# Patient Record
Sex: Female | Born: 1935 | Race: Black or African American | Hispanic: No | Smoking: Never smoker
Health system: Southern US, Community
[De-identification: ages and names within clinical notes are randomized; demographics above are authoritative.]

## PROBLEM LIST (undated history)

## (undated) DIAGNOSIS — D696 Thrombocytopenia, unspecified: Secondary | ICD-10-CM

## (undated) DIAGNOSIS — I10 Essential (primary) hypertension: Secondary | ICD-10-CM

## (undated) DIAGNOSIS — R7401 Elevation of levels of liver transaminase levels: Secondary | ICD-10-CM

## (undated) DIAGNOSIS — R9431 Abnormal electrocardiogram [ECG] [EKG]: Secondary | ICD-10-CM

## (undated) DIAGNOSIS — E872 Acidosis, unspecified: Secondary | ICD-10-CM

## (undated) DIAGNOSIS — E785 Hyperlipidemia, unspecified: Secondary | ICD-10-CM

## (undated) DIAGNOSIS — R740 Nonspecific elevation of levels of transaminase and lactic acid dehydrogenase [LDH]: Secondary | ICD-10-CM

## (undated) HISTORY — DX: Hyperlipidemia, unspecified: E78.5

## (undated) HISTORY — DX: Essential (primary) hypertension: I10

## (undated) HISTORY — PX: ABDOMINAL HYSTERECTOMY: SHX81

---

## 2000-08-22 ENCOUNTER — Ambulatory Visit (HOSPITAL_COMMUNITY): Admission: RE | Admit: 2000-08-22 | Discharge: 2000-08-22 | Payer: Self-pay | Admitting: Family Medicine

## 2000-08-22 ENCOUNTER — Encounter: Payer: Self-pay | Admitting: Family Medicine

## 2000-11-27 ENCOUNTER — Encounter: Payer: Self-pay | Admitting: Family Medicine

## 2000-11-27 ENCOUNTER — Ambulatory Visit (HOSPITAL_COMMUNITY): Admission: RE | Admit: 2000-11-27 | Discharge: 2000-11-27 | Payer: Self-pay | Admitting: Family Medicine

## 2002-08-05 ENCOUNTER — Encounter: Payer: Self-pay | Admitting: Family Medicine

## 2002-08-05 ENCOUNTER — Ambulatory Visit (HOSPITAL_COMMUNITY): Admission: RE | Admit: 2002-08-05 | Discharge: 2002-08-05 | Payer: Self-pay | Admitting: Family Medicine

## 2003-03-17 ENCOUNTER — Ambulatory Visit (HOSPITAL_COMMUNITY): Admission: RE | Admit: 2003-03-17 | Discharge: 2003-03-17 | Payer: Self-pay | Admitting: Family Medicine

## 2003-12-09 ENCOUNTER — Ambulatory Visit (HOSPITAL_COMMUNITY): Admission: RE | Admit: 2003-12-09 | Discharge: 2003-12-09 | Payer: Self-pay | Admitting: Internal Medicine

## 2003-12-09 ENCOUNTER — Ambulatory Visit: Payer: Self-pay | Admitting: Internal Medicine

## 2004-05-04 ENCOUNTER — Ambulatory Visit (HOSPITAL_COMMUNITY): Admission: RE | Admit: 2004-05-04 | Discharge: 2004-05-04 | Payer: Self-pay | Admitting: Pediatrics

## 2007-06-07 ENCOUNTER — Ambulatory Visit (HOSPITAL_COMMUNITY): Admission: RE | Admit: 2007-06-07 | Discharge: 2007-06-07 | Payer: Self-pay | Admitting: Family Medicine

## 2007-10-31 ENCOUNTER — Ambulatory Visit: Payer: Self-pay | Admitting: Orthopedic Surgery

## 2007-10-31 DIAGNOSIS — M549 Dorsalgia, unspecified: Secondary | ICD-10-CM | POA: Insufficient documentation

## 2007-10-31 DIAGNOSIS — M25559 Pain in unspecified hip: Secondary | ICD-10-CM | POA: Insufficient documentation

## 2009-02-03 HISTORY — PX: GIVENS CAPSULE STUDY: SHX5432

## 2009-02-03 HISTORY — PX: ESOPHAGOGASTRODUODENOSCOPY: SHX1529

## 2009-02-03 HISTORY — PX: COLONOSCOPY: SHX174

## 2009-02-17 ENCOUNTER — Ambulatory Visit (HOSPITAL_COMMUNITY): Admission: RE | Admit: 2009-02-17 | Discharge: 2009-02-17 | Payer: Self-pay | Admitting: Family Medicine

## 2009-02-18 ENCOUNTER — Inpatient Hospital Stay (HOSPITAL_COMMUNITY): Admission: AD | Admit: 2009-02-18 | Discharge: 2009-02-21 | Payer: Self-pay | Admitting: Family Medicine

## 2009-02-18 ENCOUNTER — Ambulatory Visit: Payer: Self-pay | Admitting: Internal Medicine

## 2009-02-19 ENCOUNTER — Encounter (INDEPENDENT_AMBULATORY_CARE_PROVIDER_SITE_OTHER): Payer: Self-pay | Admitting: Family Medicine

## 2009-02-19 ENCOUNTER — Ambulatory Visit: Payer: Self-pay | Admitting: Gastroenterology

## 2009-02-20 ENCOUNTER — Encounter (INDEPENDENT_AMBULATORY_CARE_PROVIDER_SITE_OTHER): Payer: Self-pay | Admitting: Family Medicine

## 2009-02-20 ENCOUNTER — Ambulatory Visit: Payer: Self-pay | Admitting: Gastroenterology

## 2009-02-23 ENCOUNTER — Ambulatory Visit (HOSPITAL_COMMUNITY): Admission: RE | Admit: 2009-02-23 | Discharge: 2009-02-23 | Payer: Self-pay | Admitting: Gastroenterology

## 2009-02-23 ENCOUNTER — Ambulatory Visit: Payer: Self-pay | Admitting: Gastroenterology

## 2009-05-25 DIAGNOSIS — K5732 Diverticulitis of large intestine without perforation or abscess without bleeding: Secondary | ICD-10-CM | POA: Insufficient documentation

## 2010-02-02 NOTE — Letter (Signed)
Summary: CAPSULE STUDY ORDER  CAPSULE STUDY ORDER   Imported By: Ave Filter 02/20/2009 14:44:41  _____________________________________________________________________  External Attachment:    Type:   Image     Comment:   External Document

## 2010-03-25 LAB — CROSSMATCH
ABO/RH(D): O POS
Antibody Screen: NEGATIVE

## 2010-03-25 LAB — COMPREHENSIVE METABOLIC PANEL
Alkaline Phosphatase: 49 U/L (ref 39–117)
CO2: 26 mEq/L (ref 19–32)
Creatinine, Ser: 0.66 mg/dL (ref 0.4–1.2)
Glucose, Bld: 96 mg/dL (ref 70–99)
Potassium: 3.5 mEq/L (ref 3.5–5.1)
Sodium: 139 mEq/L (ref 135–145)
Total Bilirubin: 0.5 mg/dL (ref 0.3–1.2)
Total Protein: 6.3 g/dL (ref 6.0–8.3)

## 2010-03-25 LAB — CBC
HCT: 32.1 % — ABNORMAL LOW (ref 36.0–46.0)
MCHC: 33.4 g/dL (ref 30.0–36.0)
MCV: 94.5 fL (ref 78.0–100.0)
Platelets: 193 10*3/uL (ref 150–400)
WBC: 6.6 10*3/uL (ref 4.0–10.5)

## 2010-03-25 LAB — DIFFERENTIAL
Monocytes Relative: 7 % (ref 3–12)
Neutro Abs: 4.1 10*3/uL (ref 1.7–7.7)
Neutrophils Relative %: 62 % (ref 43–77)

## 2010-03-25 LAB — HEMOGLOBIN AND HEMATOCRIT, BLOOD: HCT: 27.2 % — ABNORMAL LOW (ref 36.0–46.0)

## 2010-03-25 LAB — ABO/RH: ABO/RH(D): O POS

## 2010-05-21 NOTE — Op Note (Signed)
[name], Whitney         ACCOUNT NO.:  [id number]   MEDICAL RECORD NO.:  [id number]          PATIENT TYPE:  AMB   LOCATION:  DAY                           FACILITY:  [facility]   PHYSICIAN:  R. Roetta Sessions, M.D. DATE OF BIRTH:  1932/01/01   DATE OF PROCEDURE:  12/09/2003  DATE OF DISCHARGE:                                 OPERATIVE REPORT   PROCEDURE:  Screening colonoscopy.   INDICATION FOR PROCEDURE:  The patient is a 77 year old lady referred out of  the courtesy of Mila Homer. Sudie Bailey, M.D., for colorectal cancer screening.  She has never had a screening colonoscopy.  She is devoid of any lower GI  tract symptoms.  There is no family history of colorectal neoplasia.  Colonoscopy is now being done as a screening  maneuver.  This approach has  been discussed with the patient at length, potential risks, benefits, and  alternatives have been reviewed, questions answered, and she is agreeable.  Please see documentation in the medical record.   PROCEDURE NOTE:  O2 saturation, blood pressure, pulse, and respiration were  monitored throughout the entire procedure.   CONSCIOUS SEDATION:  Versed 4 mg IV, Demerol 75 mg IV in divided doses.   INSTRUMENT USED:  Olympus video chip system.   FINDINGS:  Digital rectal exam revealed no abnormalities.   Endoscopic findings:  The prep was adequate.   Rectum:  Examination of the rectal mucosa, including retroflexed view of the  anal verge, revealed only a single anal papilla.   Colon:  The colonic mucosa was surveyed from the rectosigmoid junction  through the left, transverse, right colon, to the area of the appendiceal  orifice, ileocecal valve, and cecum.  These structures were well-seen and  photographed for the record.  From this level the scope was slowly withdrawn  and all previously-mentioned mucosal surfaces were again seen.  The patient  was noted to have pancolonic diverticula.  The remainder of the colonic  mucosa appeared  normal.  The patient tolerated the procedure well and was  reacted in endoscopy.   IMPRESSION:  1.  Anal papilla, otherwise normal rectum.  2.  Pancolonic diverticula.  The remainder of the colonic mucosa appeared      normal.   RECOMMENDATIONS:  1.  Diverticulosis literature provided to [name].  2.  Consider repeat colonoscopy for screening purposes in 10 years.     Otelia Sergeant   RMR/MEDQ  D:  12/09/2003  T:  12/09/2003  Job:  [id number]   cc:   Mila Homer. Sudie Bailey, M.D.  [address] [place], [place] [address]  Fax: [phone]

## 2012-03-27 ENCOUNTER — Other Ambulatory Visit (HOSPITAL_COMMUNITY): Payer: Self-pay | Admitting: Cardiovascular Disease

## 2012-03-27 DIAGNOSIS — R079 Chest pain, unspecified: Secondary | ICD-10-CM

## 2012-04-03 ENCOUNTER — Ambulatory Visit (HOSPITAL_COMMUNITY)
Admission: RE | Admit: 2012-04-03 | Discharge: 2012-04-03 | Disposition: A | Discharge status: Home / Self Care | Payer: Medicare Other | Source: Ambulatory Visit | Attending: Cardiovascular Disease | Admitting: Cardiovascular Disease

## 2012-04-03 DIAGNOSIS — R5383 Other fatigue: Secondary | ICD-10-CM | POA: Insufficient documentation

## 2012-04-03 DIAGNOSIS — R42 Dizziness and giddiness: Secondary | ICD-10-CM | POA: Insufficient documentation

## 2012-04-03 DIAGNOSIS — I10 Essential (primary) hypertension: Secondary | ICD-10-CM | POA: Insufficient documentation

## 2012-04-03 DIAGNOSIS — R5381 Other malaise: Secondary | ICD-10-CM | POA: Insufficient documentation

## 2012-04-03 DIAGNOSIS — R079 Chest pain, unspecified: Secondary | ICD-10-CM | POA: Insufficient documentation

## 2012-04-03 DIAGNOSIS — E669 Obesity, unspecified: Secondary | ICD-10-CM | POA: Insufficient documentation

## 2012-04-03 HISTORY — PX: CARDIOVASCULAR STRESS TEST: SHX262

## 2012-04-03 MED ORDER — TECHNETIUM TC 99M SESTAMIBI GENERIC - CARDIOLITE
30.0000 | Freq: Once | INTRAVENOUS | Status: AC | PRN
  Administered 2012-04-03: 30 via INTRAVENOUS

## 2012-04-03 MED ORDER — REGADENOSON 0.4 MG/5ML IV SOLN
0.4000 mg | Freq: Once | INTRAVENOUS | Status: AC
  Administered 2012-04-03: 0.4 mg via INTRAVENOUS

## 2012-04-03 MED ORDER — TECHNETIUM TC 99M SESTAMIBI GENERIC - CARDIOLITE
10.0000 | Freq: Once | INTRAVENOUS | Status: AC | PRN
  Administered 2012-04-03: 10 via INTRAVENOUS

## 2012-04-03 NOTE — Procedures (Addendum)
  [facility] [address] [place] [address] [place] [address] [phone]  Cardiology Nuclear Med Study  Whitney [name] is a 79 y.o. female     MRN : [id number]     DOB: Jul 07, 1931  Procedure Date: 04/03/2012  Nuclear Med Background Indication for Stress Test:  Abnormal EKG History:  NO PRIOR CARDIAC HISTORY Cardiac Risk Factors: Hypertension, Lipids and Obesity  Symptoms:  Chest Pain, Fatigue and Light-Headedness   Nuclear Pre-Procedure Caffeine/Decaff Intake:  10:00pm NPO After: 8:00am   IV Site: R Forearm  IV 0.9% NS with Angio Cath:  22g  Chest Size (in):  N/A IV Started by: Emmit Pomfret, RN  Height: 5\' 2"  (1.575 m)  Cup Size: B  BMI:  Body mass index is 41.33 kg/(m^2). Weight:  226 lb (102.513 kg)   Tech Comments:  N/A    Nuclear Med Study 1 or 2 day study: 1 day  Stress Test Type:  Lexiscan  Order Authorizing Provider:  Nanetta Batty, MD   Resting Radionuclide: Technetium 21m Sestamibi  Resting Radionuclide Dose: 10.8 mCi   Stress Radionuclide:  Technetium 39m Sestamibi  Stress Radionuclide Dose: 30.6 mCi           Stress Protocol Rest HR: 59 Stress HR: 82  Rest BP: 157/75 Stress BP: 157/55  Exercise Time (min): n/a METS: n/a   Predicted Max HR: 142 bpm % Max HR: 57.75 bpm Rate Pressure Product: 21308  Dose of Adenosine (mg):  n/a Dose of Lexiscan: 0.4 mg  Dose of Atropine (mg): n/a Dose of Dobutamine: n/a mcg/kg/min (at max HR)  Stress Test Technologist: Esperanza Sheets, CCT Nuclear Technologist: Koren Shiver, CNMT   Rest Procedure:  Myocardial perfusion imaging was performed at rest 45 minutes following the intravenous administration of Technetium 84m Sestamibi. Stress Procedure:  The patient received IV Lexiscan 0.4 mg over 15-seconds.  Technetium 59m Sestamibi injected at 30-seconds.  There were no significant changes with Lexiscan.  Quantitative spect images were obtained after a 45 minute  delay.  Transient Ischemic Dilatation (Normal <1.22):  0.96 Lung/Heart Ratio (Normal <0.45):  0.26 QGS EDV:  67  ml QGS ESV:  19 ml LV Ejection Fraction: 72%     Rest ECG: NSR-LVH  Stress ECG: No significant change from baseline ECG  QPS Raw Data Images:  Normal; no motion artifact; normal heart/lung ratio. Stress Images:  Normal homogeneous uptake in all areas of the myocardium. Rest Images:  Normal homogeneous uptake in all areas of the myocardium. Subtraction (SDS):  No evidence of ischemia.  Impression Exercise Capacity:  Lexiscan with no exercise. BP Response:  Hypertensive blood pressure response. Clinical Symptoms:  No significant symptoms noted. ECG Impression:  No significant ST segment change suggestive of ischemia. Comparison with Prior Nuclear Study: No previous nuclear study performed  Overall Impression:  Normal stress nuclear study.  LV Wall Motion:  NL LV Function; NL Wall Motion   [name], MD  04/03/2012 12:43 PM

## 2012-10-26 ENCOUNTER — Encounter: Payer: Self-pay | Admitting: Cardiovascular Disease

## 2012-10-26 ENCOUNTER — Ambulatory Visit (INDEPENDENT_AMBULATORY_CARE_PROVIDER_SITE_OTHER): Payer: Medicare Other | Admitting: Cardiovascular Disease

## 2012-10-26 VITALS — BP 160/80 | HR 52 | Ht 65.0 in | Wt 224.0 lb

## 2012-10-26 DIAGNOSIS — E785 Hyperlipidemia, unspecified: Secondary | ICD-10-CM

## 2012-10-26 DIAGNOSIS — I10 Essential (primary) hypertension: Secondary | ICD-10-CM

## 2012-10-26 NOTE — Progress Notes (Signed)
10/26/2012 Whitney [name]   [birth year]  [id number]  Primary Physician Milana Obey, MD Primary Cardiologist: Runell Gess MD Roseanne Reno   HPI:  The patient is a 79 year old moderately-overweight widowed African American female, mother of 7 and grandmother to 10 grandchildren, referred through the courtesy of Dr. Sudie Bailey for cardiovascular evaluation because of new-onset chest pressure. She lives with her daughter and son. Her risk factors include treated hypertension and hyperlipidemia. She does not smoke or drink, nor is there a family history. She has never had a heart attack or stroke. Her past surgical history is remarkable for a remote hysterectomy. Over the last 3 weeks she has noticed substernal chest pressure, which has occurred approximately 3 times, associated with weakness. There is some radiation to her left shoulder but no shortness of breath. She had a Myoview stress test performed 04/03/12 which is entirely normal. She's had no recurrent symptoms and she was seen 6 months ago.     Current Outpatient Prescriptions  Medication Sig Dispense Refill  . atenolol (TENORMIN) 25 MG tablet Take 25 mg by mouth daily.      Marland Kitchen atorvastatin (LIPITOR) 40 MG tablet Take 40 mg by mouth at bedtime.      Marland Kitchen diltiazem (DILACOR XR) 180 MG 24 hr capsule Take 180 mg by mouth daily.      . Ferrous Sulfate (PX IRON) 27 MG TABS Take 1 tablet by mouth daily.      . furosemide (LASIX) 40 MG tablet Take 60 mg by mouth every other day.      . lisinopril (PRINIVIL,ZESTRIL) 20 MG tablet Take 20 mg by mouth daily.      . Misc Natural Products (OSTEO BI-FLEX ADV TRIPLE ST) TABS Take 1 tablet by mouth as needed.      . potassium chloride SA (K-DUR,KLOR-CON) 20 MEQ tablet Take 20 mEq by mouth. Takes it when the lasix is taken      . Vitamin D, Ergocalciferol, (DRISDOL) 50000 UNITS CAPS capsule Take 50,000 Units by mouth every 7 (seven) days.      . Vitamin Mixture (VITAMIN E  COMPLETE) CAPS Take 1 capsule by mouth daily.       No current facility-administered medications for this visit.    No Known Allergies  History   Social History  . Marital Status: Widowed    Spouse Name: N/A    Number of Children: N/A  . Years of Education: N/A   Occupational History  . Not on file.   Social History Main Topics  . Smoking status: Never Smoker   . Smokeless tobacco: Not on file  . Alcohol Use: No  . Drug Use: Not on file  . Sexual Activity: Not on file   Other Topics Concern  . Not on file   Social History Narrative  . No narrative on file     Review of Systems: General: negative for chills, fever, night sweats or weight changes.  Cardiovascular: negative for chest pain, dyspnea on exertion, edema, orthopnea, palpitations, paroxysmal nocturnal dyspnea or shortness of breath Dermatological: negative for rash Respiratory: negative for cough or wheezing Urologic: negative for hematuria Abdominal: negative for nausea, vomiting, diarrhea, bright red blood per rectum, melena, or hematemesis Neurologic: negative for visual changes, syncope, or dizziness All other systems reviewed and are otherwise negative except as noted above.    Blood pressure 160/80, pulse 52, height 5\' 5"  (1.651 m), weight 224 lb (101.606 kg).  General appearance: alert and no distress  Neck: no adenopathy, no carotid bruit, no JVD, supple, symmetrical, trachea midline and thyroid not enlarged, symmetric, no tenderness/mass/nodules Lungs: clear to auscultation bilaterally Heart: regular rate and rhythm, S1, S2 normal, no murmur, click, rub or gallop Extremities: extremities normal, atraumatic, no cyanosis or edema  EKG sinus bradycardia at 52 with evidence of left ventricular hypertrophy by voltage criteria  ASSESSMENT AND PLAN:   Essential hypertension Poorly controlled on current medications. She does check her blood pressure several times a week which  runs in the 140/80  range.  Hyperlipidemia On statin therapy. Her last lipid profile performed 03/19/12 revealed a total cholesterol of 122, LDL of 70 and HDL of 40.      Runell Gess MD FACP,FACC,FAHA, [facility] 10/26/2012 9:52 AM

## 2012-10-26 NOTE — Patient Instructions (Signed)
Dr Berry wants you to follow-up in 1 year . You will receive a reminder letter in the mail two months in advance. If you don't receive a letter, please call our office to schedule the follow-up appointment. 

## 2012-10-26 NOTE — Assessment & Plan Note (Signed)
On statin therapy. Her last lipid profile performed 03/19/12 revealed a total cholesterol of 122, LDL of 70 and HDL of 40.

## 2012-10-26 NOTE — Assessment & Plan Note (Signed)
Poorly controlled on current medications. She does check her blood pressure several times a week which  runs in the 140/80 range.

## 2013-01-21 ENCOUNTER — Other Ambulatory Visit: Payer: Self-pay

## 2013-01-21 ENCOUNTER — Encounter (HOSPITAL_COMMUNITY): Payer: Self-pay | Admitting: Emergency Medicine

## 2013-01-21 ENCOUNTER — Inpatient Hospital Stay (HOSPITAL_COMMUNITY)
Admission: EM | Admit: 2013-01-21 | Discharge: 2013-01-26 | DRG: 442 | Disposition: A | Discharge status: Home / Self Care | Payer: Medicare Other | Attending: Internal Medicine | Admitting: Internal Medicine

## 2013-01-21 ENCOUNTER — Inpatient Hospital Stay (HOSPITAL_COMMUNITY): Payer: Medicare Other

## 2013-01-21 DIAGNOSIS — D696 Thrombocytopenia, unspecified: Secondary | ICD-10-CM | POA: Diagnosis present

## 2013-01-21 DIAGNOSIS — K8000 Calculus of gallbladder with acute cholecystitis without obstruction: Secondary | ICD-10-CM | POA: Diagnosis present

## 2013-01-21 DIAGNOSIS — R7881 Bacteremia: Secondary | ICD-10-CM | POA: Diagnosis present

## 2013-01-21 DIAGNOSIS — I959 Hypotension, unspecified: Secondary | ICD-10-CM | POA: Diagnosis present

## 2013-01-21 DIAGNOSIS — R7402 Elevation of levels of lactic acid dehydrogenase (LDH): Secondary | ICD-10-CM

## 2013-01-21 DIAGNOSIS — Z79899 Other long term (current) drug therapy: Secondary | ICD-10-CM

## 2013-01-21 DIAGNOSIS — E861 Hypovolemia: Secondary | ICD-10-CM | POA: Diagnosis present

## 2013-01-21 DIAGNOSIS — I2489 Other forms of acute ischemic heart disease: Secondary | ICD-10-CM | POA: Diagnosis present

## 2013-01-21 DIAGNOSIS — N179 Acute kidney failure, unspecified: Secondary | ICD-10-CM | POA: Diagnosis present

## 2013-01-21 DIAGNOSIS — E872 Acidosis, unspecified: Secondary | ICD-10-CM | POA: Diagnosis present

## 2013-01-21 DIAGNOSIS — R112 Nausea with vomiting, unspecified: Secondary | ICD-10-CM | POA: Diagnosis present

## 2013-01-21 DIAGNOSIS — R109 Unspecified abdominal pain: Secondary | ICD-10-CM

## 2013-01-21 DIAGNOSIS — R000 Tachycardia, unspecified: Secondary | ICD-10-CM | POA: Diagnosis present

## 2013-01-21 DIAGNOSIS — I2510 Atherosclerotic heart disease of native coronary artery without angina pectoris: Secondary | ICD-10-CM | POA: Diagnosis present

## 2013-01-21 DIAGNOSIS — D6959 Other secondary thrombocytopenia: Secondary | ICD-10-CM | POA: Diagnosis present

## 2013-01-21 DIAGNOSIS — R5381 Other malaise: Secondary | ICD-10-CM

## 2013-01-21 DIAGNOSIS — R531 Weakness: Secondary | ICD-10-CM

## 2013-01-21 DIAGNOSIS — R197 Diarrhea, unspecified: Secondary | ICD-10-CM

## 2013-01-21 DIAGNOSIS — K7689 Other specified diseases of liver: Principal | ICD-10-CM | POA: Diagnosis present

## 2013-01-21 DIAGNOSIS — R9431 Abnormal electrocardiogram [ECG] [EKG]: Secondary | ICD-10-CM | POA: Diagnosis present

## 2013-01-21 DIAGNOSIS — I248 Other forms of acute ischemic heart disease: Secondary | ICD-10-CM | POA: Diagnosis present

## 2013-01-21 DIAGNOSIS — D72829 Elevated white blood cell count, unspecified: Secondary | ICD-10-CM

## 2013-01-21 DIAGNOSIS — E860 Dehydration: Secondary | ICD-10-CM | POA: Diagnosis present

## 2013-01-21 DIAGNOSIS — R740 Nonspecific elevation of levels of transaminase and lactic acid dehydrogenase [LDH]: Secondary | ICD-10-CM

## 2013-01-21 DIAGNOSIS — K219 Gastro-esophageal reflux disease without esophagitis: Secondary | ICD-10-CM | POA: Diagnosis present

## 2013-01-21 DIAGNOSIS — R5383 Other fatigue: Secondary | ICD-10-CM

## 2013-01-21 DIAGNOSIS — R1011 Right upper quadrant pain: Secondary | ICD-10-CM

## 2013-01-21 DIAGNOSIS — D62 Acute posthemorrhagic anemia: Secondary | ICD-10-CM | POA: Diagnosis present

## 2013-01-21 DIAGNOSIS — R7401 Elevation of levels of liver transaminase levels: Secondary | ICD-10-CM | POA: Diagnosis present

## 2013-01-21 DIAGNOSIS — I517 Cardiomegaly: Secondary | ICD-10-CM

## 2013-01-21 DIAGNOSIS — D649 Anemia, unspecified: Secondary | ICD-10-CM | POA: Diagnosis present

## 2013-01-21 DIAGNOSIS — E876 Hypokalemia: Secondary | ICD-10-CM | POA: Diagnosis present

## 2013-01-21 DIAGNOSIS — Z833 Family history of diabetes mellitus: Secondary | ICD-10-CM

## 2013-01-21 DIAGNOSIS — E785 Hyperlipidemia, unspecified: Secondary | ICD-10-CM | POA: Diagnosis present

## 2013-01-21 DIAGNOSIS — I10 Essential (primary) hypertension: Secondary | ICD-10-CM | POA: Diagnosis present

## 2013-01-21 LAB — COMPREHENSIVE METABOLIC PANEL
ALBUMIN: 3.1 g/dL — AB (ref 3.5–5.2)
ALT: 228 U/L — AB (ref 0–35)
AST: 102 U/L — ABNORMAL HIGH (ref 0–37)
Alkaline Phosphatase: 62 U/L (ref 39–117)
BUN: 27 mg/dL — ABNORMAL HIGH (ref 6–23)
CO2: 26 mEq/L (ref 19–32)
Calcium: 8 mg/dL — ABNORMAL LOW (ref 8.4–10.5)
Chloride: 101 mEq/L (ref 96–112)
Creatinine, Ser: 1.34 mg/dL — ABNORMAL HIGH (ref 0.50–1.10)
GFR calc non Af Amer: 37 mL/min — ABNORMAL LOW (ref >90)
GFR, EST AFRICAN AMERICAN: 42 mL/min — AB (ref >90)
GLUCOSE: 160 mg/dL — AB (ref 70–99)
POTASSIUM: 3 meq/L — AB (ref 3.7–5.3)
SODIUM: 143 meq/L (ref 137–147)
Total Bilirubin: 1.1 mg/dL (ref 0.3–1.2)
Total Protein: 5.7 g/dL — ABNORMAL LOW (ref 6.0–8.3)

## 2013-01-21 LAB — LIPASE, BLOOD: Lipase: 67 U/L — ABNORMAL HIGH (ref 11–59)

## 2013-01-21 LAB — MRSA PCR SCREENING: MRSA BY PCR: NEGATIVE

## 2013-01-21 LAB — CBC WITH DIFFERENTIAL/PLATELET
Basophils Absolute: 0 10*3/uL (ref 0.0–0.1)
Basophils Relative: 0 % (ref 0–1)
EOS ABS: 0 10*3/uL (ref 0.0–0.7)
Eosinophils Relative: 0 % (ref 0–5)
HCT: 23.6 % — ABNORMAL LOW (ref 36.0–46.0)
HEMOGLOBIN: 7.4 g/dL — AB (ref 12.0–15.0)
LYMPHS ABS: 3.4 10*3/uL (ref 0.7–4.0)
Lymphocytes Relative: 23 % (ref 12–46)
MCH: 30.6 pg (ref 26.0–34.0)
MCHC: 31.4 g/dL (ref 30.0–36.0)
MCV: 97.5 fL (ref 78.0–100.0)
MONOS PCT: 9 % (ref 3–12)
Monocytes Absolute: 1.2 10*3/uL — ABNORMAL HIGH (ref 0.1–1.0)
NEUTROS ABS: 9.9 10*3/uL — AB (ref 1.7–7.7)
NEUTROS PCT: 68 % (ref 43–77)
Platelets: 93 10*3/uL — ABNORMAL LOW (ref 150–400)
RBC: 2.42 MIL/uL — AB (ref 3.87–5.11)
RDW: 13.4 % (ref 11.5–15.5)
WBC: 14.5 10*3/uL — AB (ref 4.0–10.5)

## 2013-01-21 LAB — VITAMIN B12: Vitamin B-12: 412 pg/mL (ref 211–911)

## 2013-01-21 LAB — FOLATE: Folate: >20 ng/mL

## 2013-01-21 LAB — HEPARIN LEVEL (UNFRACTIONATED): Heparin Unfractionated: <0.1 IU/mL — ABNORMAL LOW (ref 0.30–0.70)

## 2013-01-21 LAB — LACTIC ACID, PLASMA: Lactic Acid, Venous: 4.1 mmol/L — ABNORMAL HIGH (ref 0.5–2.2)

## 2013-01-21 LAB — PREPARE RBC (CROSSMATCH)

## 2013-01-21 LAB — ABO/RH: ABO/RH(D): O POS

## 2013-01-21 LAB — HEMOGLOBIN AND HEMATOCRIT, BLOOD
HCT: 21.9 % — ABNORMAL LOW (ref 36.0–46.0)
HEMOGLOBIN: 7.4 g/dL — AB (ref 12.0–15.0)

## 2013-01-21 LAB — RETICULOCYTES
RBC.: 2.32 MIL/uL — AB (ref 3.87–5.11)
RETIC CT PCT: 3.2 % — AB (ref 0.4–3.1)
Retic Count, Absolute: 74.2 10*3/uL (ref 19.0–186.0)

## 2013-01-21 LAB — URINALYSIS, ROUTINE W REFLEX MICROSCOPIC
Glucose, UA: NEGATIVE mg/dL
HGB URINE DIPSTICK: NEGATIVE
KETONES UR: 15 mg/dL — AB
Leukocytes, UA: NEGATIVE
Nitrite: NEGATIVE
PH: 5.5 (ref 5.0–8.0)
Protein, ur: NEGATIVE mg/dL
SPECIFIC GRAVITY, URINE: 1.03 (ref 1.005–1.030)
Urobilinogen, UA: 1 mg/dL (ref 0.0–1.0)

## 2013-01-21 LAB — IRON AND TIBC
Iron: 51 ug/dL (ref 42–135)
SATURATION RATIOS: 21 % (ref 20–55)
TIBC: 240 ug/dL — AB (ref 250–470)
UIBC: 189 ug/dL (ref 125–400)

## 2013-01-21 LAB — PROTIME-INR
INR: 1.17 (ref 0.00–1.49)
Prothrombin Time: 14.7 seconds (ref 11.6–15.2)

## 2013-01-21 LAB — TROPONIN I

## 2013-01-21 LAB — FERRITIN: FERRITIN: 118 ng/mL (ref 10–291)

## 2013-01-21 MED ORDER — METOPROLOL TARTRATE 1 MG/ML IV SOLN: 2.5000 mg | Freq: Four times a day (QID) | INTRAVENOUS | Status: DC

## 2013-01-21 MED ORDER — ONDANSETRON HCL 4 MG/2ML IJ SOLN: 4.0000 mg | Freq: Four times a day (QID) | INTRAMUSCULAR | Status: DC | PRN

## 2013-01-21 MED ORDER — PIPERACILLIN-TAZOBACTAM 3.375 G IVPB
3.3750 g | Freq: Three times a day (TID) | INTRAVENOUS | Status: DC
  Administered 2013-01-21 – 2013-01-22 (×3): 3.375 g via INTRAVENOUS
  Filled 2013-01-21 (×5): qty 50

## 2013-01-21 MED ORDER — HEPARIN (PORCINE) IN NACL 100-0.45 UNIT/ML-% IJ SOLN
950.0000 [IU]/h | INTRAMUSCULAR | Status: DC
  Administered 2013-01-21: 950 [IU]/h via INTRAVENOUS

## 2013-01-21 MED ORDER — METOPROLOL TARTRATE 1 MG/ML IV SOLN
2.5000 mg | Freq: Once | INTRAVENOUS | Status: AC
  Administered 2013-01-21: 2.5 mg via INTRAVENOUS
  Filled 2013-01-21: qty 5

## 2013-01-21 MED ORDER — SODIUM CHLORIDE 0.9 % IJ SOLN
3.0000 mL | Freq: Two times a day (BID) | INTRAMUSCULAR | Status: DC
  Administered 2013-01-21 – 2013-01-22 (×2): 3 mL via INTRAVENOUS
  Administered 2013-01-22: 11:00:00 via INTRAVENOUS
  Administered 2013-01-23 – 2013-01-26 (×7): 3 mL via INTRAVENOUS

## 2013-01-21 MED ORDER — PANTOPRAZOLE SODIUM 40 MG IV SOLR
40.0000 mg | Freq: Two times a day (BID) | INTRAVENOUS | Status: DC
  Administered 2013-01-21 – 2013-01-23 (×5): 40 mg via INTRAVENOUS
  Filled 2013-01-21 (×7): qty 40

## 2013-01-21 MED ORDER — IOHEXOL 300 MG/ML  SOLN: 25.0000 mL | INTRAMUSCULAR | Status: AC

## 2013-01-21 MED ORDER — SODIUM CHLORIDE 0.9 % IV SOLN: INTRAVENOUS | Status: DC

## 2013-01-21 MED ORDER — VANCOMYCIN HCL IN DEXTROSE 750-5 MG/150ML-% IV SOLN
750.0000 mg | Freq: Two times a day (BID) | INTRAVENOUS | Status: DC
  Administered 2013-01-22: 750 mg via INTRAVENOUS
  Filled 2013-01-21 (×2): qty 150

## 2013-01-21 MED ORDER — PIPERACILLIN-TAZOBACTAM 3.375 G IVPB 30 MIN: 3.3750 g | Freq: Three times a day (TID) | INTRAVENOUS | Status: DC

## 2013-01-21 MED ORDER — HEPARIN BOLUS VIA INFUSION
4000.0000 [IU] | Freq: Once | INTRAVENOUS | Status: AC
  Administered 2013-01-21: 4000 [IU] via INTRAVENOUS

## 2013-01-21 MED ORDER — ASPIRIN 81 MG PO CHEW
324.0000 mg | CHEWABLE_TABLET | Freq: Once | ORAL | Status: AC
  Administered 2013-01-21: 324 mg via ORAL
  Filled 2013-01-21: qty 4

## 2013-01-21 MED ORDER — ACETAMINOPHEN 650 MG RE SUPP: 650.0000 mg | Freq: Four times a day (QID) | RECTAL | Status: DC | PRN

## 2013-01-21 MED ORDER — ALUM & MAG HYDROXIDE-SIMETH 200-200-20 MG/5ML PO SUSP: 30.0000 mL | Freq: Four times a day (QID) | ORAL | Status: DC | PRN

## 2013-01-21 MED ORDER — METOPROLOL TARTRATE 1 MG/ML IV SOLN
2.5000 mg | Freq: Three times a day (TID) | INTRAVENOUS | Status: DC
  Administered 2013-01-21 – 2013-01-22 (×3): 2.5 mg via INTRAVENOUS
  Filled 2013-01-21 (×6): qty 5

## 2013-01-21 MED ORDER — POTASSIUM CHLORIDE CRYS ER 20 MEQ PO TBCR
40.0000 meq | EXTENDED_RELEASE_TABLET | Freq: Once | ORAL | Status: AC
  Administered 2013-01-21: 40 meq via ORAL
  Filled 2013-01-21: qty 2

## 2013-01-21 MED ORDER — HEPARIN (PORCINE) IN NACL 100-0.45 UNIT/ML-% IJ SOLN
INTRAMUSCULAR | Status: AC
  Administered 2013-01-21: 950 [IU]/h via INTRAVENOUS
  Filled 2013-01-21: qty 250

## 2013-01-21 MED ORDER — HYDROCODONE-ACETAMINOPHEN 5-325 MG PO TABS: 1.0000 | ORAL_TABLET | ORAL | Status: DC | PRN

## 2013-01-21 MED ORDER — LORAZEPAM 0.5 MG PO TABS
0.5000 mg | ORAL_TABLET | Freq: Every evening | ORAL | Status: DC | PRN
  Administered 2013-01-24 – 2013-01-25 (×2): 0.5 mg via ORAL
  Filled 2013-01-21 (×2): qty 1

## 2013-01-21 MED ORDER — ONDANSETRON HCL 4 MG PO TABS: 4.0000 mg | ORAL_TABLET | Freq: Four times a day (QID) | ORAL | Status: DC | PRN

## 2013-01-21 MED ORDER — SODIUM CHLORIDE 0.9 % IV BOLUS (SEPSIS)
1000.0000 mL | Freq: Once | INTRAVENOUS | Status: AC
  Administered 2013-01-21: 1000 mL via INTRAVENOUS

## 2013-01-21 MED ORDER — HYDROMORPHONE HCL PF 1 MG/ML IJ SOLN
1.0000 mg | INTRAMUSCULAR | Status: DC | PRN
  Administered 2013-01-22: 1 mg via INTRAVENOUS
  Filled 2013-01-21 (×2): qty 1

## 2013-01-21 MED ORDER — ACETAMINOPHEN 325 MG PO TABS: 650.0000 mg | ORAL_TABLET | Freq: Four times a day (QID) | ORAL | Status: DC | PRN

## 2013-01-21 MED ORDER — VANCOMYCIN HCL 10 G IV SOLR
1500.0000 mg | Freq: Once | INTRAVENOUS | Status: AC
  Administered 2013-01-21: 1500 mg via INTRAVENOUS
  Filled 2013-01-21: qty 1500

## 2013-01-21 NOTE — ED Provider Notes (Signed)
TIME SEEN: 9:34 AM  CHIEF COMPLAINT: generalized weakness and diaphoresis  HPI: HPI Comments: Whitney [name] is a 80 y.o. female with a hx of HTN, hyperlipidemia who presents to the Emergency Department complaining of weakness, diaphoresis. Patient reports that 3 days ago she had 3 days of nonbloody, nonbilious vomiting and nonbloody diarrhea. This resolved over the weekend but she still feels very weak. She denies any chest pain or shortness of breath. No bloody stools or melena. No dysuria or hematuria. No fevers or chills. No prior history of MI, cardiac catheterization. Patient was hypotensive with EMS but this is improving with IV fluids.   Milana ObeyKNOWLTON,STEPHEN D, MD  ROS: See HPI Constitutional: no fever  Eyes: no drainage  ENT: no runny nose   Cardiovascular:  no chest pain  Resp: no SOB  GI:  vomiting GU: no dysuria Integumentary: no rash  Allergy: no hives  Musculoskeletal: no leg swelling  Neurological: no slurred speech ROS otherwise negative  PAST MEDICAL HISTORY/PAST SURGICAL HISTORY:  Past Medical History  Diagnosis Date  . Hyperlipidemia   . Hypertension     MEDICATIONS:  Prior to Admission medications   Medication Sig Start Date End Date Taking? Authorizing Provider  atenolol (TENORMIN) 25 MG tablet Take 25 mg by mouth daily.    Historical Provider, MD  atorvastatin (LIPITOR) 40 MG tablet Take 40 mg by mouth at bedtime.    Historical Provider, MD  diltiazem (DILACOR XR) 180 MG 24 hr capsule Take 180 mg by mouth daily.    Historical Provider, MD  Ferrous Sulfate (PX IRON) 27 MG TABS Take 1 tablet by mouth daily.    Historical Provider, MD  furosemide (LASIX) 40 MG tablet Take 60 mg by mouth every other day.    Historical Provider, MD  lisinopril (PRINIVIL,ZESTRIL) 20 MG tablet Take 20 mg by mouth daily.    Historical Provider, MD  Misc Natural Products (OSTEO BI-FLEX ADV TRIPLE ST) TABS Take 1 tablet by mouth as needed.    Historical Provider, MD  potassium  chloride SA (K-DUR,KLOR-CON) 20 MEQ tablet Take 20 mEq by mouth. Takes it when the lasix is taken    Historical Provider, MD  Vitamin D, Ergocalciferol, (DRISDOL) 50000 UNITS CAPS capsule Take 50,000 Units by mouth every 7 (seven) days.    Historical Provider, MD  Vitamin Mixture (VITAMIN E COMPLETE) CAPS Take 1 capsule by mouth daily.    Historical Provider, MD    ALLERGIES:  No Known Allergies  SOCIAL HISTORY:  History  Substance Use Topics  . Smoking status: Never Smoker   . Smokeless tobacco: Never Used  . Alcohol Use: No    FAMILY HISTORY: Family History  Problem Relation Age of Onset  . Diabetes Child     EXAM: BP 92/55  Pulse 125  Temp(Src) 97.5 F (36.4 C)  Resp 30  Ht 5\' 5"  (1.651 m)  Wt 210 lb (95.255 kg)  BMI 34.95 kg/m2  SpO2 100% CONSTITUTIONAL: Alert and oriented and responds appropriately to questions. Well-appearing; well-nourished HEAD: Normocephalic EYES: Conjunctivae clear, PERRL ENT: normal nose; no rhinorrhea; dry mucous membranes; pharynx without lesions noted NECK: Supple, no meningismus, no LAD  CARD: regular and tachycardic; S1 and S2 appreciated; no murmurs, no clicks, no rubs, no gallops RESP: Normal chest excursion without splinting or tachypnea; breath sounds clear and equal bilaterally; no wheezes, no rhonchi, no rales,  ABD/GI: Normal bowel sounds; non-distended; soft, mildly diffusely tender to palpation, no rebound, no guarding, no peritoneal signs  BACK:  The back appears normal and is non-tender to palpation, there is no CVA tenderness EXT: Normal ROM in all joints; non-tender to palpation; no edema; normal capillary refill; no cyanosis    SKIN: Normal color for age and race; warm NEURO: Moves all extremities equally PSYCH: The patient's mood and manner are appropriate. Grooming and personal hygiene are appropriate.  MEDICAL DECISION MAKING: Patient here with hypotension, tachycardia. Concern for possible dehydration versus cardiogenic  shock. EKG shows diffuse ST depression with elevation in aVR concerning for possible STEMI. Will discuss with cardiology on call. Will obtain labs, urine, cultures and continue IV fluids.  ED PROGRESS: 9:40 AM  Discussed with Dr. Excell Seltzer with cardiology. He agrees that we should send the patient emergently to [facility] cone for further evaluation and admission to the CCU. He does not think we need to call this a code STEMI at this time. He does recommend giving patient aspirin and heparin. We'll continue IV fluids. If blood pressure improves, Dr. Excell Seltzer would like [place] to administer 2.5 mg of IV metoprolol. Patient and daughter updated with plan. Labs pending.   10:10 AM  Patient's blood pressure has improved to 120s/60s. Will give small dose of metoprolol. Her heart rate is still in the 100s. She is still not having chest pain or shortness of breath. Continuing fluids and heparin.   CRITICAL CARE Performed by: Raelyn Number   Total critical care time: 30 minutes  Critical care time was exclusive of separately billable procedures and treating other patients.  hypotension, tachycardia, IV fluids, possible STEMI, heparin drip  Critical care was necessary to treat or prevent imminent or life-threatening deterioration.  Critical care was time spent personally by me on the following activities: development of treatment plan with patient and/or surrogate as well as nursing, discussions with consultants, evaluation of patient's response to treatment, examination of patient, obtaining history from patient or surrogate, ordering and performing treatments and interventions, ordering and review of laboratory studies, ordering and review of radiographic studies, pulse oximetry and re-evaluation of patient's condition.    EKG Interpretation    Date/Time:  Monday January 21 2013 09:33:36 EST Ventricular Rate:  114 PR Interval:  138 QRS Duration: 92 QT Interval:  374 QTC Calculation: 515 R Axis:   26 Text  Interpretation:  Sinus tachycardia Marked ST abnormality, possible inferior subendocardial injury Marked ST abnormality, possible anterior subendocardial injury Abnormal ECG Confirmed by WARD  DO, KRISTEN ([id number]) on 01/21/2013 3:04:42 PM            Angiocath insertion Performed by: Raelyn Number  Consent: Verbal consent obtained. Risks and benefits: risks, benefits and alternatives were discussed Time out: Immediately prior to procedure a "time out" was called to verify the correct patient, procedure, equipment, support staff and site/side marked as required.  Preparation: Patient was prepped and draped in the usual sterile fashion.  Vein Location: left IJ  Gauge: 20 gauge  Normal blood return and flush without difficulty Patient tolerance: Patient tolerated the procedure well with no immediate complications.       Layla Maw Ward, DO 01/21/13 1620

## 2013-01-21 NOTE — H&P (Signed)
History and Physical       Hospital Admission Note Date: 01/21/2013  Patient name: Whitney [name] Medical record number: [id number] Date of birth: 1931/12/25 Age: 80 y.o. Gender: female  PCP: Milana Obey, MD    Chief Complaint:  Nausea, vomiting, abdominal pain for last 5 days with diarrhea  HPI: Patient is a 80 year old female with history of hypertension, hyperlipidemia, GERD presented to [facility] ED for nausea, vomiting, diarrhea and profound weakness in the last 5 days. History was obtained from the patient who reported that her nausea and vomiting, abdominal pain and diarrhea started Wednesday last week. Diarrhea has resolved since Friday. She called EMS and was found to have hypotension with BP of 70/44. BP was stabilized but patient was found to have marked ST depressions in the anterior and inferior leads. Cardiology was consulted and patient was transferred to [facility] due to concern for ACS. Patient was seen by Dr. Shirlee Latch from cardiology service and bedside echo was performed which showed EF vigorous 65-70%, no wall motion abnormalities. Small LV cavity, IVC was small/collapsed.  Cardiology did not think it was cardiac cause of her symptoms and patient likely had acute intra-abdominal process or acute cholecystitis. IM service was requested for admission.     Review of Systems:  Constitutional: + fever, chills, diaphoresis,+ poor appetite and fatigue.  HEENT: Denies photophobia, eye pain, redness, hearing loss, ear pain, congestion, sore throat, rhinorrhea, sneezing, mouth sores, trouble swallowing, neck pain, neck stiffness and tinnitus.   Respiratory: Denies SOB, DOE, cough, chest tightness,  and wheezing.   Cardiovascular: Denies chest pain, palpitations and leg swelling.  Gastrointestinal: Please see history of present illness  Genitourinary: Denies dysuria, urgency, frequency, hematuria, flank pain and difficulty  urinating.  Musculoskeletal: Denies myalgias, back pain, joint swelling, arthralgias and gait problem.  Skin: Denies pallor, rash and wound.  Neurological: Denies dizziness, seizures, syncope, light-headedness, numbness and headaches. + generalized weakness Hematological: Denies adenopathy. Easy bruising, personal or family bleeding history  Psychiatric/Behavioral: Denies suicidal ideation, mood changes, confusion, nervousness, sleep disturbance and agitation  Past Medical History: Past Medical History  Diagnosis Date  . Hyperlipidemia   . Hypertension    Past Surgical History  Procedure Laterality Date  . Cardiovascular stress test  04/03/2012    Normal study. No evidence of ischemia.  . Abdominal hysterectomy      Medications: Prior to Admission medications   Medication Sig Start Date End Date Taking? Authorizing Provider  atenolol (TENORMIN) 25 MG tablet Take 25 mg by mouth daily.   Yes Historical Provider, MD  atorvastatin (LIPITOR) 40 MG tablet Take 40 mg by mouth at bedtime.   Yes Historical Provider, MD  diltiazem (DILACOR XR) 180 MG 24 hr capsule Take 180 mg by mouth daily.   Yes Historical Provider, MD  Ferrous Sulfate (PX IRON) 27 MG TABS Take 1 tablet by mouth daily.   Yes Historical Provider, MD  furosemide (LASIX) 40 MG tablet Take 60 mg by mouth daily.    Yes Historical Provider, MD  lisinopril (PRINIVIL,ZESTRIL) 20 MG tablet Take 20 mg by mouth daily.   Yes Historical Provider, MD  LORazepam (ATIVAN) 0.5 MG tablet Take 0.5-1 mg by mouth at bedtime as needed for sleep.   Yes Historical Provider, MD  Misc Natural Products (OSTEO BI-FLEX ADV TRIPLE ST) TABS Take 1 tablet by mouth as needed.   Yes Historical Provider, MD  omeprazole (PRILOSEC) 40 MG capsule Take 40 mg by mouth 2 (two) times daily.   Yes Historical  Provider, MD  potassium chloride SA (K-DUR,KLOR-CON) 20 MEQ tablet Take 20 mEq by mouth daily. Takes it with the lasix   Yes Historical Provider, MD  Vitamin D,  Ergocalciferol, (DRISDOL) 50000 UNITS CAPS capsule Take 50,000 Units by mouth every 7 (seven) days.   Yes Historical Provider, MD  Vitamin Mixture (VITAMIN E COMPLETE) CAPS Take 1 capsule by mouth daily.   Yes Historical Provider, MD    Allergies:  No Known Allergies  Social History:  reports that she has never smoked. She has never used smokeless tobacco. She reports that she does not drink alcohol or use illicit drugs.  Family History: Family History  Problem Relation Age of Onset  . Diabetes Child     Physical Exam: Blood pressure 137/111, pulse 101, temperature 98 F (36.7 C), temperature source Oral, resp. rate 20, height 5\' 5"  (1.651 m), weight 98.4 kg (216 lb 14.9 oz), SpO2 100.00%. General: Alert, awake, oriented x3, in no acute distress. HEENT: normocephalic, atraumatic, anicteric sclera, pink conjunctiva, pupils equal and reactive to light and accomodation, oropharynx clear, dry mucosal membranes Neck: supple, no masses or lymphadenopathy, no goiter, no bruits  Heart: Tachycardia, Regular rate and rhythm, without murmurs, rubs or gallops. Lungs:Clear to auscultation bilaterally, no wheezing, rales or rhonchi. Abdomen: Soft, right upper quadrant and epigastric abdominal tenderness to palpation, no rebound or guarding, nondistended, positive bowel sounds, no masses. Extremities: No clubbing, cyanosis or edema with positive pedal pulses. Neuro: Grossly intact, no focal neurological deficits, strength 5/5 upper and lower extremities bilaterally Psych: alert and oriented x 3, normal mood and affect Skin: no rashes or lesions, warm and dry   LABS on Admission:  Basic Metabolic Panel:  Recent Labs Lab 01/21/13 1020  NA 143  K 3.0*  CL 101  CO2 26  GLUCOSE 160*  BUN 27*  CREATININE 1.34*  CALCIUM 8.0*   Liver Function Tests:  Recent Labs Lab 01/21/13 1020  AST 102*  ALT 228*  ALKPHOS 62  BILITOT 1.1  PROT 5.7*  ALBUMIN 3.1*    Recent Labs Lab  01/21/13 1020  LIPASE 67*   No results found for this basename: AMMONIA,  in the last 168 hours CBC:  Recent Labs Lab 01/21/13 1020  WBC 14.5*  NEUTROABS 9.9*  HGB 7.4*  HCT 23.6*  MCV 97.5  PLT 93*   Cardiac Enzymes:  Recent Labs Lab 01/21/13 1020  TROPONINI <0.30   BNP: No components found with this basename: POCBNP,  CBG: No results found for this basename: GLUCAP,  in the last 168 hours   Radiological Exams on Admission: No results found.  Assessment/Plan Principal Problem:   Hypotension with lactic acidosis, hypokalemia, AKI:  due to profound dehydration, GI losses secondary to vomiting, diarrhea, poor appetite, SIRS/ sepsis. Right upper quadrant abdominal pain with leukocytosis, transaminitis, elevated lipase pointing towards possible acute intra-abdominal pathology or acute cholecystitis. - Patient is admitted to step down unit, continue aggressive IV fluid hydration , antiemetics, pain control, and NPO status - Blood cultures, UA has been ordered, patient is currently on vancomycin and Zosyn per pharmacy -Abdominal ultrasound is ordered, discussed with surgery, will follow recommendations  Active Problems:   Hyperlipidemia - Hold statins    abnormal electrocardiogram (EKG)/diffuse ST depressions, tachycardia - Discussed it with Dr. Shirlee LatchMcLean, who does not feel that patient has any cardiac cause of her symptoms/ACS, discontinued IV heparin, and negative troponin x1 - Given hypotension, tachycardia with ST depressions, will transfuse 2 units of packed RBCs - Continue  aggressive IV fluid hydration, IV Lopressor with parameters    Anemia, unspecified patient denies any hematochezia or melena or hematemesis, hemoglobin 7.4 - Baseline hemoglobin unknown, given EKG changes and hemodynamic instability, transfuse 2 units of packed RBCs - Obtain anemia panel, fecal occult blood test    Lactic acidosis: Likely due to #1  DVT prophylaxis:  SCDs until GI bleed ruled  out, await FOBT  CODE STATUS: Full code  Family Communication: Admission, patients condition and plan of care including tests being ordered have been discussed with the patient , son and daughter-in-law who indicates understanding and agree with the plan and Code Status   Further plan will depend as patient's clinical course evolves and further radiologic and laboratory data become available.   Time Spent on Admission: 1 hour   RAI,RIPUDEEP M.D. [facility] 01/21/2013, 1:23 PM Pager: [phone]  If 7PM-7AM, please contact night-coverage [email or web address] Password [email or web address]

## 2013-01-21 NOTE — Consult Note (Addendum)
Patient ID: Whitney [name]  MRN: [id number], DOB/AGE: 1931-02-14  Admit date: 01/21/2013  Primary Physician: Milana Obey, MD  Primary Cardiologist: Dr. Allyson Sabal   HPI: Whitney [name] is a 80 year old African American female with a past medical history of hypertension, hyperlipidemia and no past cardiac history who was brought to [facility] today by EMS with profound weakness. She had n/v and diarrhea from Wednesday of last week until Friday and residual weakness and fatigue over the weekend. She became so profoundly weak today that she called EMS. EMS reported a blood pressure of 70/44 and 90/48 upon arrival to [facility]. Her blood pressure stabilized, but EKG revealed marked new ST depression in anterior and inferior leads. These are new from previous tracings in 10/2012. She was transferred to [facility] for cardiology consult. She admits to not taking her blood pressure medications since the illness and poor PO intake. She denies chest pain, SOB, diaphoresis, palpitations, orthopnea, PND, or swelling. She does admit to some right upper quadrant pain and lightheadedness. Of note, she was seen by Dr. Allyson Sabal in October of 2014 for evaluation of substernal chest pressure, which had occurred approximately 3 times and was associated with weakness. There was some radiation to her left shoulder but no shortness of breath. She had a Myoview stress test performed 04/03/12 which is entirely normal. She denies recurrent chest pain or pressure. Her risk factors for CAD include treated hypertension and hyperlipidemia. She does not smoke or drink, nor is there a family history. She has never had a heart attack or stroke. Her past surgical history is remarkable for a remote hysterectomy.   Problem List  Past Medical History   Diagnosis  Date   .  Hyperlipidemia    .  Hypertension     Past Surgical History   Procedure  Laterality  Date   .  Cardiovascular stress test   04/03/2012     Normal study.  No evidence of ischemia.   .  Abdominal hysterectomy      Allergies  No Known Allergies  Home Medications  Prior to Admission medications   Medication  Sig  Start Date  End Date  Taking?  Authorizing Provider   atenolol (TENORMIN) 25 MG tablet  Take 25 mg by mouth daily.     Historical Provider, MD   atorvastatin (LIPITOR) 40 MG tablet  Take 40 mg by mouth at bedtime.     Historical Provider, MD   diltiazem (DILACOR XR) 180 MG 24 hr capsule  Take 180 mg by mouth daily.     Historical Provider, MD   Ferrous Sulfate (PX IRON) 27 MG TABS  Take 1 tablet by mouth daily.     Historical Provider, MD   furosemide (LASIX) 40 MG tablet  Take 60 mg by mouth every other day.     Historical Provider, MD   lisinopril (PRINIVIL,ZESTRIL) 20 MG tablet  Take 20 mg by mouth daily.     Historical Provider, MD   Misc Natural Products (OSTEO BI-FLEX ADV TRIPLE ST) TABS  Take 1 tablet by mouth as needed.     Historical Provider, MD   potassium chloride SA (K-DUR,KLOR-CON) 20 MEQ tablet  Take 20 mEq by mouth. Takes it when the lasix is taken     Historical Provider, MD   Vitamin D, Ergocalciferol, (DRISDOL) 50000 UNITS CAPS capsule  Take 50,000 Units by mouth every 7 (seven) days.     Historical Provider, MD   Vitamin Mixture (VITAMIN  E COMPLETE) CAPS  Take 1 capsule by mouth daily.     Historical Provider, MD   Family History  Family History   Problem  Relation  Age of Onset   .  Diabetes  Child     Social History  History    Social History   .  Marital Status:  Widowed     Spouse Name:  N/A     Number of Children:  N/A   .  Years of Education:  N/A    Occupational History   .  Not on file.    Social History Main Topics   .  Smoking status:  Never Smoker   .  Smokeless tobacco:  Never Used   .  Alcohol Use:  No   .  Drug Use:  No   .  Sexual Activity:  Not on file    Other Topics  Concern   .  Not on file    Social History Narrative   .  No narrative on file    All other systems reviewed and  are otherwise negative except as noted above.  Physical Exam  Blood pressure 124/51, pulse 99, temperature 97.5 F (36.4 C), resp. rate 16, height 5\' 5"  (1.651 m), weight 216 lb 14.9 oz (98.4 kg), SpO2 99.00%.  General: Pleasant, NAD  Psych: Normal affect.  Neuro: Alert and oriented X 3. Moves all extremities spontaneously.  HEENT: Normal  Neck: Supple without bruits or No JVD.  Lungs: Resp regular and unlabored, CTA.  Heart: RRR no s3, s4, or murmurs.  Abdomen: Soft, non-tender, non-distended, BS + x 4.  Extremities: No clubbing, cyanosis or edema. DP/PT/Radials 2+ and equal bilaterally.  Labs   Recent Labs   01/21/13 1020   TROPONINI  <0.30    Lab Results   Component  Value  Date    WBC  14.5*  01/21/2013    HGB  7.4*  01/21/2013    HCT  23.6*  01/21/2013    MCV  97.5  01/21/2013    PLT  93*  01/21/2013    Recent Labs  Lab  01/21/13 1020   NA  143   K  3.0*   CL  101   CO2  26   BUN  27*   CREATININE  1.34*   CALCIUM  8.0*   PROT  5.7*   BILITOT  1.1   ALKPHOS  62   ALT  228*   AST  102*   GLUCOSE  160*    Radiology/Studies  No results found.   ECG  Sinus tach HR 126 with LVH and marked ST depression in ant and inf leads.   ASSESSMENT AND PLAN  Whitney [name] is a 80 y.o. female with a history of hypertension, hyperlipidemia and no past cardiac history who was brought to [facility] today by EMS with profound weakness. She had n/v and diarrhea from Wednesday of last week until Friday and residual weakness and fatigue over the weekend. She became so profoundly weak today that she called EMS. EMS reported a blood pressure of 70/44 and 90/48 upon arrival to [facility]. Her blood pressure stabilized, but EKG revealed marked new ST depression in anterior and inferior leads. She was transferred to [facility] for cardiology consult.   EKG changes- new ST depression in ant/inf leads. New since last tracing.  -- No chest pain currently. No history of CAD. RFs include  HTN, HLD, age. No family history. She  does not drink and is a never smoker.  -- Troponin neg x1  -- ECHO pending   Hypotension- likely secondary to dehydration   Hypokalemia- K 3.0 likely secondary to vomiting, will give Kdur   Leukocytosis and lactic acidosis- WBC 14.5, Lactic acid 4.1 has had gastroenteritis like syndrome, unknown etiology. Will ask IM to admit for further work up. Will draw blood cultures and start Vanc/zosyn.   Elevated transaminases- AST/ALT 102/228. RUQ pain. Consider cholecystis. Will order a RUQ [place].   HTN- BP's have been soft likely due to dehydration. Will increase fluids to 75. She currently takes lisinopril 20 mg qd, Cardizem CD 180 mg qd, atenolol 25 mg qd at home. She also takes 40 mg of Lasix as needed for LE swelling.   Acute kidney injury- Cr 1.34 likely secondary to dehydration, continue IV hydration   HLD- continue Lipitor   Anemia- 7.4/23.6. Repeat H/H in an hour and do a stool guaiac.   Urban Gibson, PA-C  01/21/2013, 11:38 AM  Pager [phone]    Patient seen with PA, agree with the above note. She has had RUQ pain with nausea/vomiting since last Wednesday. She has had no chest pain. WBCs high, lactic acid high, lipase and transaminases high. She was hypotensive initially, better post-IVF. Echo reviewed at bedside, EF vigorous 65-70%, no wall motion abnormalities. Small LV cavity, IVC was small/collapsed.  1. ID: Strong suspicion for acute cholecystitis (most likely, or other abdominal process). She has a tender RUQ. Suspect early septic shock with lactic acidosis and soft BP.  - Abdominal US stat  - Aggressive IV fluid rehydration: 1 L now then 100 cc/hr x 2 more liters.  - Blood cultures then cover empirically with Zosyn/vancomycin.  - Will ask [facility] to take over care and will consult general surgery.  2. CAD: No chest pain. TnI negative x 1. ECG shows LVH with extensive repolarization abnormalities. These are more prominent than  on last ECG but she is also tachycardic. Doubt ACS. Hold BP-active medications with low blood pressure. Can stop heparin.  3. Hypokalemia: replete 4. Anemia: Uncertain etiology.  Transfuse hemoglobin < 7.  Will get serial CBCs.  Guaiac stool.  Marca Ancona  01/21/2013  12:50 PM

## 2013-01-21 NOTE — Care Management Note (Signed)
    Page 1 of 1   01/21/2013     12:00:50 PM   CARE MANAGEMENT NOTE 01/21/2013  Patient:  Whitney [name],Whitney [name]   Account Number:  [id number]  Date Initiated:  01/21/2013  Documentation initiated by:  Junius CreamerWELL,DEBBIE  Subjective/Objective Assessment:   adm w hypotension, abn ekg     Action/Plan:   lives alone, pcp dr John Giovannistephen knowlton   Anticipated DC Date:     Anticipated DC Plan:           Choice offered to / List presented to:             Status of service:   Medicare Important Message given?   (If response is "NO", the following Medicare IM given date fields will be blank) Date Medicare IM given:   Date Additional Medicare IM given:    Discharge Disposition:    Per UR Regulation:  Reviewed for med. necessity/level of care/duration of stay  If discussed at Long Length of Stay Meetings, dates discussed:    Comments:

## 2013-01-21 NOTE — Consult Note (Signed)
I have seen and examined the patient and agree with the assessment and plans.  I suspect cholecystitis.  Will see what her ultrasound shows.  Of concern, however is her anemia and its source.   May need a preop workup of this (CT or endo).  We will follow her closely  Javar Eshbach A. Magnus IvanBlackman  MD, FACS

## 2013-01-21 NOTE — Progress Notes (Signed)
  Echocardiogram 2D Echocardiogram has been performed.  Whitney [name] 01/21/2013, 1:04 PM

## 2013-01-21 NOTE — Consult Note (Signed)
Reason for Consult: acute cholecystitis  Referring Physician: Dr. Estill Cotta  HPI: Whitney [name] si a 80 year old female with a history of hypertension, HLD, GERD, obesity who presented to [facility] with malaise and weakness. The patient states she developed nausea and vomiting late Wednesday night.  This lasted until Friday.  Symptoms are associated with RUQ pain, diarrhea, fever, chills and sweats.  Symptoms are moderate to severe in severity.  Time pattern is intermittent. Coarse is worsening.  She was unable to get up on Saturday and had profound weakness.  She reports prior mild post prandial symptoms that would typically resolve on its own.  She denies recent weight loss, melena or hematochezia.  She denies chest pains.  Upon evaluation at [place] as found to have EKG changes, diaphoresis and thought the source of symptoms was related to cardiac related and therefore the patient was transferred to [facility].  Upon further evaluation, she did not have chest pains, a echocardiogram did not reveal any wall motion abnormalities.  Furthermore, she was tender to RUQ, had a white count of 14.5K and elevated transaminase.  Her BP was noted at 70/44 in the ED, now stable and hypertensive.  She is receiving IVF bolus for dehydration.  She also has a h&h of 7.4/23.6 for which she is being transfused for per primary team.  At present time, she rates her pain at 6/10.  Denies nausea or vomiting.  She is NPO, has not eaten much since Wednesday.  She is not on blood thinners, no significant cardiac history.    Past Medical History  Diagnosis Date  . Hyperlipidemia   . Hypertension     Past Surgical History  Procedure Laterality Date  . Cardiovascular stress test  04/03/2012    Normal study. No evidence of ischemia.  . Abdominal hysterectomy      Family History  Problem Relation Age of Onset  . Diabetes Child     Social History:  reports that she has never smoked. She has never used  smokeless tobacco. She reports that she does not drink alcohol or use illicit drugs.  Allergies: No Known Allergies  Medications:  Prior to Admission:  Prescriptions prior to admission  Medication Sig Dispense Refill  . atenolol (TENORMIN) 25 MG tablet Take 25 mg by mouth daily.      Marland Kitchen atorvastatin (LIPITOR) 40 MG tablet Take 40 mg by mouth at bedtime.      Marland Kitchen diltiazem (DILACOR XR) 180 MG 24 hr capsule Take 180 mg by mouth daily.      . Ferrous Sulfate (PX IRON) 27 MG TABS Take 1 tablet by mouth daily.      . furosemide (LASIX) 40 MG tablet Take 60 mg by mouth daily.       Marland Kitchen lisinopril (PRINIVIL,ZESTRIL) 20 MG tablet Take 20 mg by mouth daily.      Marland Kitchen LORazepam (ATIVAN) 0.5 MG tablet Take 0.5-1 mg by mouth at bedtime as needed for sleep.      . Misc Natural Products (OSTEO BI-FLEX ADV TRIPLE ST) TABS Take 1 tablet by mouth as needed.      Marland Kitchen omeprazole (PRILOSEC) 40 MG capsule Take 40 mg by mouth 2 (two) times daily.      . potassium chloride SA (K-DUR,KLOR-CON) 20 MEQ tablet Take 20 mEq by mouth daily. Takes it with the lasix      . Vitamin D, Ergocalciferol, (DRISDOL) 50000 UNITS CAPS capsule Take 50,000 Units by mouth every 7 (  seven) days.      . Vitamin Mixture (VITAMIN E COMPLETE) CAPS Take 1 capsule by mouth daily.       Scheduled: . metoprolol  2.5 mg Intravenous Q8H  . pantoprazole (PROTONIX) IV  40 mg Intravenous Q12H  . piperacillin-tazobactam (ZOSYN)  IV  3.375 g Intravenous Q8H  . potassium chloride  40 mEq Oral Once  . sodium chloride  3 mL Intravenous Q12H   Continuous: . sodium chloride     MWN:UUVOZDGUYQIHK, acetaminophen, alum & mag hydroxide-simeth, HYDROcodone-acetaminophen, HYDROmorphone (DILAUDID) injection, LORazepam, ondansetron (ZOFRAN) IV, ondansetron  Results for orders placed during the hospital encounter of 01/21/13 (from the past 48 hour(s))  CBC WITH DIFFERENTIAL     Status: Abnormal   Collection Time    01/21/13 10:20 AM      Result Value Range   WBC  14.5 (*) 4.0 - 10.5 K/uL   RBC 2.42 (*) 3.87 - 5.11 MIL/uL   Hemoglobin 7.4 (*) 12.0 - 15.0 g/dL   HCT 23.6 (*) 36.0 - 46.0 %   MCV 97.5  78.0 - 100.0 fL   MCH 30.6  26.0 - 34.0 pg   MCHC 31.4  30.0 - 36.0 g/dL   RDW 13.4  11.5 - 15.5 %   Platelets 93 (*) 150 - 400 K/uL   Comment: SPECIMEN CHECKED FOR CLOTS     PLATELET COUNT CONFIRMED BY SMEAR   Neutrophils Relative % 68  43 - 77 %   Neutro Abs 9.9 (*) 1.7 - 7.7 K/uL   Lymphocytes Relative 23  12 - 46 %   Lymphs Abs 3.4  0.7 - 4.0 K/uL   Monocytes Relative 9  3 - 12 %   Monocytes Absolute 1.2 (*) 0.1 - 1.0 K/uL   Eosinophils Relative 0  0 - 5 %   Eosinophils Absolute 0.0  0.0 - 0.7 K/uL   Basophils Relative 0  0 - 1 %   Basophils Absolute 0.0  0.0 - 0.1 K/uL  COMPREHENSIVE METABOLIC PANEL     Status: Abnormal   Collection Time    01/21/13 10:20 AM      Result Value Range   Sodium 143  137 - 147 mEq/L   Potassium 3.0 (*) 3.7 - 5.3 mEq/L   Chloride 101  96 - 112 mEq/L   CO2 26  19 - 32 mEq/L   Glucose, Bld 160 (*) 70 - 99 mg/dL   BUN 27 (*) 6 - 23 mg/dL   Creatinine, Ser 1.34 (*) 0.50 - 1.10 mg/dL   Calcium 8.0 (*) 8.4 - 10.5 mg/dL   Total Protein 5.7 (*) 6.0 - 8.3 g/dL   Albumin 3.1 (*) 3.5 - 5.2 g/dL   AST 102 (*) 0 - 37 U/L   ALT 228 (*) 0 - 35 U/L   Alkaline Phosphatase 62  39 - 117 U/L   Total Bilirubin 1.1  0.3 - 1.2 mg/dL   GFR calc non Af Amer 37 (*) >90 mL/min   GFR calc Af Amer 42 (*) >90 mL/min   Comment: (NOTE)     The eGFR has been calculated using the CKD EPI equation.     This calculation has not been validated in all clinical situations.     eGFR's persistently <90 mL/min signify possible Chronic Kidney     Disease.  LIPASE, BLOOD     Status: Abnormal   Collection Time    01/21/13 10:20 AM      Result Value Range   Lipase  67 (*) 11 - 59 U/L  TROPONIN I     Status: None   Collection Time    01/21/13 10:20 AM      Result Value Range   Troponin I <0.30  <0.30 ng/mL   Comment:            Due to the  release kinetics of cTnI,     a negative result within the first hours     of the onset of symptoms does not rule out     myocardial infarction with certainty.     If myocardial infarction is still suspected,     repeat the test at appropriate intervals.  LACTIC ACID, PLASMA     Status: Abnormal   Collection Time    01/21/13 10:20 AM      Result Value Range   Lactic Acid, Venous 4.1 (*) 0.5 - 2.2 mmol/L  PROTIME-INR     Status: None   Collection Time    01/21/13 10:20 AM      Result Value Range   Prothrombin Time 14.7  11.6 - 15.2 seconds   INR 1.17  0.00 - 1.49  HEPARIN LEVEL (UNFRACTIONATED)     Status: Abnormal   Collection Time    01/21/13 10:20 AM      Result Value Range   Heparin Unfractionated <0.10 (*) 0.30 - 0.70 IU/mL   Comment:            IF HEPARIN RESULTS ARE BELOW     EXPECTED VALUES, AND PATIENT     DOSAGE HAS BEEN CONFIRMED,     SUGGEST FOLLOW UP TESTING     OF ANTITHROMBIN III LEVELS.    No results found.  Review of Systems  All other systems reviewed and are negative.   Blood pressure 137/111, pulse 101, temperature 98 F (36.7 C), temperature source Oral, resp. rate 20, height 5' 5"  (1.651 m), weight 216 lb 14.9 oz (98.4 kg), SpO2 100.00%. Physical Exam  Constitutional: She is oriented to person, place, and time. She appears well-developed and well-nourished. No distress.  HENT:  Mouth/Throat: No oropharyngeal exudate.  Dry mucus membranes   Neck: Normal range of motion. Neck supple.  Cardiovascular: Normal rate, regular rhythm, normal heart sounds and intact distal pulses.  Exam reveals no gallop and no friction rub.   No murmur heard. Respiratory: Effort normal and breath sounds normal. No respiratory distress. She has no wheezes. She has no rales. She exhibits no tenderness.  GI: Soft. Bowel sounds are normal. She exhibits no mass.  TTP RUQ, no guarding or evidence of peritonitis.  Well healed midline incision  Musculoskeletal: Normal range of  motion. She exhibits no edema.  Lymphadenopathy:    She has no cervical adenopathy.  Neurological: She is alert and oriented to person, place, and time.  Skin: Skin is warm and dry. No rash noted. She is not diaphoretic. No erythema. No pallor.  Psychiatric: She has a normal mood and affect. Her behavior is normal. Judgment and thought content normal.    Assessment/Plan: Abdominal pain, nausea vomiting Leukocytosis Lactic Acidosis transaminitis This is likely related to acute cholecystitis.  We will obtain a [place] of gallbladder for definitive diagnosis.  If [place] is positive, she will need a laparoscopic possibly open cholecystectomy.  Given her other medical issues, an alternative would be a percutaneous cholecystostomy tube.  At this time, NPO, start zosyn, anti-emetics and pain control.  We will await ultrasound results for further recommendations.  Hypokalemia -GI losses,  supplement per primary team Dehydration -poor oral intake, rehydrate with IV fluids AKI -prerenal, hydrate Acute on chronic anemia  -transfuse per primary team, 2 units today   Tiffany Calmes, [facility] ANP-BC Pager [phone] 01/21/2013, 1:48 PM

## 2013-01-21 NOTE — Progress Notes (Signed)
ANTICOAGULATION CONSULT NOTE - Initial Consult  Pharmacy Consult for Heparin Indication: chest pain/ACS  No Known Allergies  Patient Measurements: Height: 5\' 5"  (165.1 cm) Weight: 210 lb (95.255 kg) IBW/kg (Calculated) : 57 Heparin Dosing Weight: 80 kg  Vital Signs: Temp: 97.5 F (36.4 C) (01/19 0930) BP: 92/55 mmHg (01/19 0930) Pulse Rate: 125 (01/19 0930)  Labs: No results found for this basename: HGB, HCT, PLT, APTT, LABPROT, INR, HEPARINUNFRC, CREATININE, CKTOTAL, CKMB, TROPONINI,  in the last 72 hours  Estimated Creatinine Clearance: 65.1 ml/min (by C-G formula based on Cr of 0.66).   Medical History: Past Medical History  Diagnosis Date  . Hyperlipidemia   . Hypertension     Medications:  Scheduled:    PTA meds reviewed  Assessment: Okay for Protocol, baseline labs pending.  No bleeding noted.  Goal of Therapy:  Heparin level 0.3-0.7 units/ml Monitor platelets by anticoagulation protocol: Yes   Plan:  Give 4000 units bolus x 1 Start heparin infusion at 950 units/hr Check anti-Xa level in 6-8 hours and daily while on heparin Continue to monitor H&H and platelets  Lamonte RicherHayes, Sheralyn Pinegar R 01/21/2013,10:12 AM

## 2013-01-21 NOTE — H&P (Deleted)
Patient ID: BRISEYDA FEHR MRN: [id number], DOB/AGE: May 25, 1931   Admit date: 01/21/2013   Primary Physician: Milana Obey, MD Primary Cardiologist: Dr. Allyson Sabal    HPI: Mrs. Dimitroff is a 80 year old  African American female with a past medical history of hypertension, hyperlipidemia and no past cardiac history who was brought to [facility] today by EMS with profound weakness. She had n/v and diarrhea from Wednesday of last week until Friday and residual weakness and fatigue over the weekend. She became so profoundly weak today that she called EMS. EMS reported a blood pressure of 70/44 and 90/48 upon arrival to [facility]. Her blood pressure stabilized, but EKG revealed marked new ST depression in anterior and inferior leads. These are new from previous tracings in 10/2012. She was transferred to [facility] for cardiology consult. She admits to not taking her blood pressure medications since the illness and poor PO intake. She denies chest pain, SOB, diaphoresis, palpitations, orthopnea, PND, or swelling. She does admit to some right upper quadrant pain and lightheadedness. Of note, she was seen by Dr. Allyson Sabal in October of 2014 for evaluation of substernal chest pressure, which had occurred approximately 3 times and was associated with weakness. There was some radiation to her left shoulder but no shortness of breath. She had a Myoview stress test performed 04/03/12 which is entirely normal. She denies recurrent chest pain or pressure. Her risk factors for CAD include treated hypertension and hyperlipidemia. She does not smoke or drink, nor is there a family history. She has never had a heart attack or stroke. Her past surgical history is remarkable for a remote hysterectomy.    Problem List  Past Medical History  Diagnosis Date  . Hyperlipidemia   . Hypertension     Past Surgical History  Procedure Laterality Date  . Cardiovascular stress test  04/03/2012    Normal study. No  evidence of ischemia.  . Abdominal hysterectomy       Allergies  No Known Allergies  Home Medications  Prior to Admission medications   Medication Sig Start Date End Date Taking? Authorizing Provider  atenolol (TENORMIN) 25 MG tablet Take 25 mg by mouth daily.    Historical Provider, MD  atorvastatin (LIPITOR) 40 MG tablet Take 40 mg by mouth at bedtime.    Historical Provider, MD  diltiazem (DILACOR XR) 180 MG 24 hr capsule Take 180 mg by mouth daily.    Historical Provider, MD  Ferrous Sulfate (PX IRON) 27 MG TABS Take 1 tablet by mouth daily.    Historical Provider, MD  furosemide (LASIX) 40 MG tablet Take 60 mg by mouth every other day.    Historical Provider, MD  lisinopril (PRINIVIL,ZESTRIL) 20 MG tablet Take 20 mg by mouth daily.    Historical Provider, MD  Misc Natural Products (OSTEO BI-FLEX ADV TRIPLE ST) TABS Take 1 tablet by mouth as needed.    Historical Provider, MD  potassium chloride SA (K-DUR,KLOR-CON) 20 MEQ tablet Take 20 mEq by mouth. Takes it when the lasix is taken    Historical Provider, MD  Vitamin D, Ergocalciferol, (DRISDOL) 50000 UNITS CAPS capsule Take 50,000 Units by mouth every 7 (seven) days.    Historical Provider, MD  Vitamin Mixture (VITAMIN E COMPLETE) CAPS Take 1 capsule by mouth daily.    Historical Provider, MD    Family History  Family History  Problem Relation Age of Onset  . Diabetes Child     Social History  History  Social History  . Marital Status: Widowed    Spouse Name: N/A    Number of Children: N/A  . Years of Education: N/A   Occupational History  . Not on file.   Social History Main Topics  . Smoking status: Never Smoker   . Smokeless tobacco: Never Used  . Alcohol Use: No  . Drug Use: No  . Sexual Activity: Not on file   Other Topics Concern  . Not on file   Social History Narrative  . No narrative on file     All other systems reviewed and are otherwise negative except as noted above.  Physical  Exam  Blood pressure 124/51, pulse 99, temperature 97.5 F (36.4 C), resp. rate 16, height 5\' 5"  (1.651 m), weight 216 lb 14.9 oz (98.4 kg), SpO2 99.00%.  General: Pleasant, NAD Psych: Normal affect. Neuro: Alert and oriented X 3. Moves all extremities spontaneously. HEENT: Normal  Neck: Supple without bruits or No JVD. Lungs:  Resp regular and unlabored, CTA. Heart: RRR no s3, s4, or murmurs. Abdomen: Soft, non-tender, non-distended, BS + x 4.  Extremities: No clubbing, cyanosis or edema. DP/PT/Radials 2+ and equal bilaterally.  Labs   Recent Labs  01/21/13 1020  TROPONINI <0.30   Lab Results  Component Value Date   WBC 14.5* 01/21/2013   HGB 7.4* 01/21/2013   HCT 23.6* 01/21/2013   MCV 97.5 01/21/2013   PLT 93* 01/21/2013    Recent Labs Lab 01/21/13 1020  NA 143  K 3.0*  CL 101  CO2 26  BUN 27*  CREATININE 1.34*  CALCIUM 8.0*  PROT 5.7*  BILITOT 1.1  ALKPHOS 62  ALT 228*  AST 102*  GLUCOSE 160*     Radiology/Studies  No results found.  ECG  Sinus tach HR 126 with LVH and marked ST depression in ant and inf leads.   ASSESSMENT AND PLAN Liliane ShiStella A Wamser is a 80 y.o. female with a history of hypertension, hyperlipidemia and no past cardiac history who was brought [facility] today by EMS with profound weakness. She had n/v and diarrhea from Wednesday of last week until Friday and residual weakness and fatigue over the weekend. She became so profoundly weak today that she called EMS. EMS reported a blood pressure of 70/44 and 90/48 upon arrival to [facility]. Her blood pressure stabilized, but EKG revealed marked new ST depression in anterior and inferior leads. [facility].  EKG changes- new ST depression in ant/inf leads. New since last tracing. -- No chest pain currently. No history of CAD. RFs include HTN, HLD, age. No family history. She does not drink and is a never smoker.  -- Troponin neg x1 -- ECHO  pending   Hypotension- likely secondary to dehydration  Hypokalemia- K 3.0 likely secondary to vomiting, will give 40mEQ Kdur   Leukocytosis and lactic acidosis-  WBC 14.5, Lactic acid 4.1 has had gastroenteritis like syndrome, unknown etiology. Will ask IM to admit for further work up. Will draw blood cultures and start Vanc/zosyn.  Elevated transaminases- AST/ALT 102/228. RUQ pain. Consider cholecystis. Will order a RUQ US.   HTN- BP's have been soft likely due to dehydration. Will increase fluids to 75. She currently takes lisinopril 20 mg qd, Cardizem CD 180 mg qd, atenolol 25 mg qd at home. She also takes 40 mg of Lasix as needed for LE swelling.   Acute kidney injury- Cr 1.34 likely secondary to dehydration, continue IV  hydration   HLD- continue Lipitor   Anemia- 7.4/23.6. Repeat H/H in an hour and do a stool guaiac.  Urban Gibson, PA-C 01/21/2013, 11:38 AM  Pager [phone]  Patient seen with PA, agree with the above note.  She has had RUQ pain with nausea/vomiting since last Wednesday.  She has had no chest pain.  WBCs high, lactic acid high, lipase and transaminases high.  She was hypotensive initially, better post-IVF.  Echo reviewed at bedside, EF vigorous 65-70%, no wall motion abnormalities. Small LV cavity, IVC was small/collapsed.  1. ID: Strong suspicion for acute cholecystitis.  She has a tender RUQ.  Suspect early septic shock with lactic acidosis and soft BP.  - Abdominal US stat - Aggressive IV fluid rehydration: 1 L now then 100 cc/hr x 2 more liters.  - Blood cultures then cover empirically with Zosyn/vancomycin. - Will ask [facility] to take over care and will consult general surgery.   2. CAD: No chest pain.  TnI negative x 1.  ECG shows LVH with extensive repolarization abnormalities.  These are more prominent than on last ECG but she is also tachycardic.  Doubt ACS. Hold BP-active medications with low blood pressure.  Can stop heparin.   Marca Ancona 01/21/2013 12:50 PM

## 2013-01-21 NOTE — ED Notes (Addendum)
Patient brought in via EMS. Alert and oriented. Airway patent. Patient c/o nausea and vomiting last week with some diarrhea. Denies any fevers, pain, or shortness of breath. Patient diaphoretic. Patient reports now being very fatigue. Per EMS patient's blood pressure 70/44. Per EMS patient's blood pressure rechecked prior to coming into ER- 90/48. Patient has hx of hypertension but denies taking any medication in past 2 days due to vomiting.

## 2013-01-21 NOTE — Progress Notes (Signed)
ANTIBIOTIC CONSULT NOTE - INITIAL  Pharmacy Consult for vancomycin/zosyn Indication: empiric coverage  No Known Allergies  Patient Measurements: Height: 5\' 5"  (165.1 cm) Weight: 216 lb 14.9 oz (98.4 kg) IBW/kg (Calculated) : 57  Vital Signs: Temp: 98 F (36.7 C) (01/19 1130) Temp src: Oral (01/19 1130) BP: 137/111 mmHg (01/19 1130) Pulse Rate: 101 (01/19 1130) Intake/Output from previous day:   Intake/Output from this shift:    Labs:  Recent Labs  01/21/13 1020  WBC 14.5*  HGB 7.4*  PLT 93*  CREATININE 1.34*   Estimated Creatinine Clearance: 39.6 ml/min (by C-G formula based on Cr of 1.34). No results found for this basename: VANCOTROUGH, Leodis Binet, VANCORANDOM, GENTTROUGH, GENTPEAK, GENTRANDOM, TOBRATROUGH, TOBRAPEAK, TOBRARND, AMIKACINPEAK, AMIKACINTROU, AMIKACIN,  in the last 72 hours   Microbiology: Recent Results (from the past 720 hour(s))  MRSA PCR SCREENING     Status: None   Collection Time    01/21/13 11:33 AM      Result Value Range Status   MRSA by PCR NEGATIVE  NEGATIVE Final   Comment:            The GeneXpert MRSA Assay (FDA     approved for NASAL specimens     only), is one component of a     comprehensive MRSA colonization     surveillance program. It is not     intended to diagnose MRSA     infection nor to guide or     monitor treatment for     MRSA infections.    Medical History: Past Medical History  Diagnosis Date  . Hyperlipidemia   . Hypertension     Medications:  Prescriptions prior to admission  Medication Sig Dispense Refill  . atenolol (TENORMIN) 25 MG tablet Take 25 mg by mouth daily.      Marland Kitchen atorvastatin (LIPITOR) 40 MG tablet Take 40 mg by mouth at bedtime.      Marland Kitchen diltiazem (DILACOR XR) 180 MG 24 hr capsule Take 180 mg by mouth daily.      . Ferrous Sulfate (PX IRON) 27 MG TABS Take 1 tablet by mouth daily.      . furosemide (LASIX) 40 MG tablet Take 60 mg by mouth daily.       Marland Kitchen lisinopril (PRINIVIL,ZESTRIL) 20 MG  tablet Take 20 mg by mouth daily.      Marland Kitchen LORazepam (ATIVAN) 0.5 MG tablet Take 0.5-1 mg by mouth at bedtime as needed for sleep.      . Misc Natural Products (OSTEO BI-FLEX ADV TRIPLE ST) TABS Take 1 tablet by mouth as needed.      Marland Kitchen omeprazole (PRILOSEC) 40 MG capsule Take 40 mg by mouth 2 (two) times daily.      . potassium chloride SA (K-DUR,KLOR-CON) 20 MEQ tablet Take 20 mEq by mouth daily. Takes it with the lasix      . Vitamin D, Ergocalciferol, (DRISDOL) 50000 UNITS CAPS capsule Take 50,000 Units by mouth every 7 (seven) days.      . Vitamin Mixture (VITAMIN E COMPLETE) CAPS Take 1 capsule by mouth daily.       Scheduled:  . metoprolol  2.5 mg Intravenous Q8H  . pantoprazole (PROTONIX) IV  40 mg Intravenous Q12H  . piperacillin-tazobactam (ZOSYN)  IV  3.375 g Intravenous Q8H  . potassium chloride  40 mEq Oral Once  . sodium chloride  3 mL Intravenous Q12H    Assessment: [name] with suspiciion of acute cholecystitis and  pharmacy has been consulted to dose vancomycin and zosyn for empiric coverage. WBC= 14.5, SCr= 1.34 and CrCl ~ 40. Zosyn has been ordred per MD at 3.375gm IV q8h.  1/19 vanc>> 1/19 zosyn>>  1/19 urine 1/19 blood x2   Goal of Therapy:  Vancomycin trough level 15-20 mcg/ml  Plan:  -No zosyn changes needed -vancomycin 1500mg  IV followed by 750mg  IV q12h -Will follow renal function, cultures and clinical progress  Harland Germanndrew Amberley Hamler, Pharm D 01/21/2013 2:09 PM

## 2013-01-22 ENCOUNTER — Inpatient Hospital Stay (HOSPITAL_COMMUNITY): Payer: Medicare Other

## 2013-01-22 ENCOUNTER — Encounter (HOSPITAL_COMMUNITY): Payer: Self-pay | Admitting: Radiology

## 2013-01-22 DIAGNOSIS — N179 Acute kidney failure, unspecified: Secondary | ICD-10-CM

## 2013-01-22 DIAGNOSIS — R7881 Bacteremia: Secondary | ICD-10-CM | POA: Diagnosis present

## 2013-01-22 DIAGNOSIS — K7689 Other specified diseases of liver: Secondary | ICD-10-CM | POA: Diagnosis present

## 2013-01-22 DIAGNOSIS — D696 Thrombocytopenia, unspecified: Secondary | ICD-10-CM | POA: Diagnosis present

## 2013-01-22 LAB — TYPE AND SCREEN
ABO/RH(D): O POS
Antibody Screen: NEGATIVE
Unit division: 0
Unit division: 0

## 2013-01-22 LAB — CBC
HCT: 26.8 % — ABNORMAL LOW (ref 36.0–46.0)
HEMOGLOBIN: 9.2 g/dL — AB (ref 12.0–15.0)
MCH: 30.9 pg (ref 26.0–34.0)
MCHC: 34.3 g/dL (ref 30.0–36.0)
MCV: 89.9 fL (ref 78.0–100.0)
Platelets: 95 10*3/uL — ABNORMAL LOW (ref 150–400)
RBC: 2.98 MIL/uL — ABNORMAL LOW (ref 3.87–5.11)
RDW: 15.9 % — ABNORMAL HIGH (ref 11.5–15.5)
WBC: 12.2 10*3/uL — ABNORMAL HIGH (ref 4.0–10.5)

## 2013-01-22 LAB — COMPREHENSIVE METABOLIC PANEL
ALK PHOS: 64 U/L (ref 39–117)
ALT: 176 U/L — AB (ref 0–35)
AST: 74 U/L — ABNORMAL HIGH (ref 0–37)
Albumin: 2.8 g/dL — ABNORMAL LOW (ref 3.5–5.2)
BUN: 23 mg/dL (ref 6–23)
CO2: 26 meq/L (ref 19–32)
Calcium: 7.5 mg/dL — ABNORMAL LOW (ref 8.4–10.5)
Chloride: 105 mEq/L (ref 96–112)
Creatinine, Ser: 1.02 mg/dL (ref 0.50–1.10)
GFR, EST AFRICAN AMERICAN: 59 mL/min — AB (ref >90)
GFR, EST NON AFRICAN AMERICAN: 51 mL/min — AB (ref >90)
Glucose, Bld: 108 mg/dL — ABNORMAL HIGH (ref 70–99)
POTASSIUM: 3.6 meq/L — AB (ref 3.7–5.3)
Sodium: 143 mEq/L (ref 137–147)
TOTAL PROTEIN: 5.3 g/dL — AB (ref 6.0–8.3)
Total Bilirubin: 1.1 mg/dL (ref 0.3–1.2)

## 2013-01-22 LAB — TROPONIN I

## 2013-01-22 LAB — LACTIC ACID, PLASMA: Lactic Acid, Venous: 1.1 mmol/L (ref 0.5–2.2)

## 2013-01-22 LAB — OCCULT BLOOD X 1 CARD TO LAB, STOOL: Fecal Occult Bld: NEGATIVE

## 2013-01-22 MED ORDER — DILTIAZEM HCL ER 180 MG PO CP24
180.0000 mg | ORAL_CAPSULE | Freq: Every day | ORAL | Status: DC
  Administered 2013-01-22 – 2013-01-26 (×5): 180 mg via ORAL
  Filled 2013-01-22 (×6): qty 1

## 2013-01-22 MED ORDER — METOPROLOL TARTRATE 1 MG/ML IV SOLN: 5.0000 mg | INTRAVENOUS | Status: DC

## 2013-01-22 MED ORDER — VANCOMYCIN HCL IN DEXTROSE 750-5 MG/150ML-% IV SOLN
750.0000 mg | Freq: Two times a day (BID) | INTRAVENOUS | Status: DC
  Administered 2013-01-22 – 2013-01-23 (×2): 750 mg via INTRAVENOUS
  Filled 2013-01-22 (×3): qty 150

## 2013-01-22 MED ORDER — POTASSIUM CHLORIDE CRYS ER 20 MEQ PO TBCR
40.0000 meq | EXTENDED_RELEASE_TABLET | Freq: Two times a day (BID) | ORAL | Status: DC
  Administered 2013-01-22 – 2013-01-23 (×3): 40 meq via ORAL
  Filled 2013-01-22 (×4): qty 2

## 2013-01-22 MED ORDER — IOHEXOL 300 MG/ML  SOLN
80.0000 mL | Freq: Once | INTRAMUSCULAR | Status: AC | PRN
  Administered 2013-01-22: 80 mL via INTRAVENOUS

## 2013-01-22 MED ORDER — ATENOLOL 25 MG PO TABS
25.0000 mg | ORAL_TABLET | Freq: Every day | ORAL | Status: DC
  Administered 2013-01-22 – 2013-01-26 (×5): 25 mg via ORAL
  Filled 2013-01-22 (×6): qty 1

## 2013-01-22 MED ORDER — OXYCODONE HCL 5 MG PO TABS
5.0000 mg | ORAL_TABLET | ORAL | Status: DC | PRN
  Administered 2013-01-22 – 2013-01-25 (×3): 5 mg via ORAL
  Filled 2013-01-22 (×3): qty 1

## 2013-01-22 MED ORDER — METOPROLOL TARTRATE 1 MG/ML IV SOLN: 2.5000 mg | INTRAVENOUS | Status: DC

## 2013-01-22 NOTE — Plan of Care (Cosign Needed)
Preliminary blood cx positive for GPC in clusters- only one bottle obtained- will repeat x 2 collections but has already been given anbx's so ?? Accuracy if truly bactermia. Will also dc narcotics until after HIDA complete and make NPO  Junious SilkAllison Autry Prust, ANP

## 2013-01-22 NOTE — Progress Notes (Signed)
Patient ID: Liliane ShiStella A Gignac, female   DOB: [birth year], [age] y.o.   MRN: [id number]   SUBJECTIVE: Still with RUQ pain but improved.  BP now running high.   . metoprolol  2.5 mg Intravenous Q4H  . pantoprazole (PROTONIX) IV  40 mg Intravenous Q12H  . piperacillin-tazobactam (ZOSYN)  IV  3.375 g Intravenous Q8H  . sodium chloride  3 mL Intravenous Q12H  . vancomycin  750 mg Intravenous Q12H      Filed Vitals:   01/22/13 0319 01/22/13 0400 01/22/13 0500 01/22/13 0600  BP:  150/44 168/55 137/51  Pulse: 81 86 79   Temp: 99.1 F (37.3 C)     TempSrc: Oral     Resp: 22 24 29    Height:      Weight:      SpO2: 100% 99% 99%     Intake/Output Summary (Last 24 hours) at 01/22/13 0735 Last data filed at 01/22/13 0700  Gross per 24 hour  Intake 3647.5 ml  Output    250 ml  Net 3397.5 ml    LABS: Basic Metabolic Panel:  Recent Labs  40/98/1101/19/15 1020 01/22/13 0145  NA 143 143  K 3.0* 3.6*  CL 101 105  CO2 26 26  GLUCOSE 160* 108*  BUN 27* 23  CREATININE 1.34* 1.02  CALCIUM 8.0* 7.5*   Liver Function Tests:  Recent Labs  01/21/13 1020 01/22/13 0145  AST 102* 74*  ALT 228* 176*  ALKPHOS 62 64  BILITOT 1.1 1.1  PROT 5.7* 5.3*  ALBUMIN 3.1* 2.8*    Recent Labs  01/21/13 1020  LIPASE 67*   CBC:  Recent Labs  01/21/13 1020 01/21/13 1421 01/22/13 0145  WBC 14.5*  --  12.2*  NEUTROABS 9.9*  --   --   HGB 7.4* 7.4* 9.2*  HCT 23.6* 21.9* 26.8*  MCV 97.5  --  89.9  PLT 93*  --  95*   Cardiac Enzymes:  Recent Labs  01/21/13 1020 01/21/13 1421 01/22/13 0145  TROPONINI <0.30 <0.30 <0.30   BNP: No components found with this basename: POCBNP,  D-Dimer: No results found for this basename: DDIMER,  in the last 72 hours Hemoglobin A1C: No results found for this basename: HGBA1C,  in the last 72 hours Fasting Lipid Panel: No results found for this basename: CHOL, HDL, LDLCALC, TRIG, CHOLHDL, LDLDIRECT,  in the last 72 hours Thyroid Function Tests: No  results found for this basename: TSH, T4TOTAL, FREET3, T3FREE, THYROIDAB,  in the last 72 hours Anemia Panel:  Recent Labs  01/21/13 1421  VITAMINB12 412  FOLATE >20.0  FERRITIN 118  TIBC 240*  IRON 51  RETICCTPCT 3.2*    RADIOLOGY: [place] Abdomen Complete  01/21/2013   CLINICAL DATA:  Right upper quadrant pain  EXAM: ULTRASOUND ABDOMEN COMPLETE  COMPARISON:  None.  FINDINGS: Gallbladder:  Gallstone stone within the gallbladder. No sonographic Murphy sign noted.  Common bile duct:  Diameter: 4.3 mm  Liver:  There is a complex cystic lesion in the liver measuring 13.4 x 6.4 x 12.5 cm.  IVC:  No abnormality visualized.  Pancreas:  Visualized portion unremarkable.  Spleen:  Size and appearance within normal limits.  Right Kidney:  Length: 11.3 cm. Echogenicity within normal limits. No mass or hydronephrosis visualized.  Left Kidney:  Length: 11.2 cm. Echogenicity within normal limits. No mass or hydronephrosis visualized.  Abdominal aorta:  No aneurysm visualized.  Bifurcation not seen  Other findings:  Perisplenic ascites.  IMPRESSION: There is a  complex cystic lesion in the liver measuring 13.4 x 6.4 x 12.5 cm. Further evaluation with three-phase liver CT is recommended.  Cholelithiasis without sonographic evidence of acute cholecystitis.   Electronically Signed   By: Sherian Rein M.D.   On: 01/21/2013 15:59    PHYSICAL EXAM General: NAD Neck: No JVD, no thyromegaly or thyroid nodule.  Lungs: Clear to auscultation bilaterally with normal respiratory effort. CV: Nondisplaced PMI.  Heart regular S1/S2, no S3/S4, no murmur.  No peripheral edema.  No carotid bruit.  Normal pedal pulses.  Abdomen: Soft, nontender, no hepatosplenomegaly, no distention.  Neurologic: Alert and oriented x 3.  Psych: Normal affect. Extremities: No clubbing or cyanosis.   TELEMETRY: Reviewed telemetry pt in NSR  ASSESSMENT AND PLAN: 80 yo with presented with RUQ pain concerning for acute cholecystitis.  RUQ US  showed gallstones but no definite cholecystitis.  There was a large cystic structure, however, in the liver.  Echo showed vigorous EF with focal basal septal hypertrophy.  Troponin not elevated and no chest pain.  I do not think that she needs further cardiac workup.  I will increase metoprolol to 2.5 mg IV every 4 hours. This can be increased to 5 mg if needed.  I will sign off.  Please call with questions.   Marca Ancona 01/22/2013

## 2013-01-22 NOTE — Progress Notes (Signed)
Subjective: Patient reports RUQ abdominal pain is less  Objective: Vital signs in last 24 hours: Temp:  [97.5 F (36.4 C)-100 F (37.8 C)] 99.1 F (37.3 C) (01/20 0319) Pulse Rate:  [79-125] 79 (01/20 0500) Resp:  [15-30] 29 (01/20 0500) BP: (92-168)/(13-111) 137/51 mmHg (01/20 0600) SpO2:  [99 %-100 %] 99 % (01/20 0500) Weight:  [210 lb (95.255 kg)-216 lb 14.9 oz (98.4 kg)] 216 lb 14.9 oz (98.4 kg) (01/19 1130) Last BM Date: 01/18/13  Intake/Output from previous day: 01/19 0701 - 01/20 0700 In: 3647.5 [P.O.:840; I.V.:1370; Blood:662.5; IV Piggyback:775] Out: 250 [Urine:250] Intake/Output this shift:    Still with mild tenderness and guarding in the RUQ of the abdomen  Lab Results:   Recent Labs  01/21/13 1020 01/21/13 1421 01/22/13 0145  WBC 14.5*  --  12.2*  HGB 7.4* 7.4* 9.2*  HCT 23.6* 21.9* 26.8*  PLT 93*  --  95*   BMET  Recent Labs  01/21/13 1020 01/22/13 0145  NA 143 143  K 3.0* 3.6*  CL 101 105  CO2 26 26  GLUCOSE 160* 108*  BUN 27* 23  CREATININE 1.34* 1.02  CALCIUM 8.0* 7.5*   PT/INR  Recent Labs  01/21/13 1020  LABPROT 14.7  INR 1.17   ABG No results found for this basename: PHART, PCO2, PO2, HCO3,  in the last 72 hours  Studies/Results: US Abdomen Complete  01/21/2013   CLINICAL DATA:  Right upper quadrant pain  EXAM: ULTRASOUND ABDOMEN COMPLETE  COMPARISON:  None.  FINDINGS: Gallbladder:  Gallstone stone within the gallbladder. No sonographic Murphy sign noted.  Common bile duct:  Diameter: 4.3 mm  Liver:  There is a complex cystic lesion in the liver measuring 13.4 x 6.4 x 12.5 cm.  IVC:  No abnormality visualized.  Pancreas:  Visualized portion unremarkable.  Spleen:  Size and appearance within normal limits.  Right Kidney:  Length: 11.3 cm. Echogenicity within normal limits. No mass or hydronephrosis visualized.  Left Kidney:  Length: 11.2 cm. Echogenicity within normal limits. No mass or hydronephrosis visualized.  Abdominal  aorta:  No aneurysm visualized.  Bifurcation not seen  Other findings:  Perisplenic ascites.  IMPRESSION: There is a complex cystic lesion in the liver measuring 13.4 x 6.4 x 12.5 cm. Further evaluation with three-phase liver CT is recommended.  Cholelithiasis without sonographic evidence of acute cholecystitis.   Electronically Signed   By: Sherian Rein M.D.   On: 01/21/2013 15:59    Anti-infectives: Anti-infectives   Start     Dose/Rate Route Frequency Ordered Stop   01/22/13 0300  vancomycin (VANCOCIN) IVPB 750 mg/150 ml premix     750 mg 150 mL/hr over 60 Minutes Intravenous Every 12 hours 01/21/13 1410     01/21/13 1500  vancomycin (VANCOCIN) 1,500 mg in sodium chloride 0.9 % 500 mL IVPB     1,500 mg 250 mL/hr over 120 Minutes Intravenous  Once 01/21/13 1410 01/21/13 2120   01/21/13 1430  piperacillin-tazobactam (ZOSYN) IVPB 3.375 g     3.375 g 12.5 mL/hr over 240 Minutes Intravenous 3 times per day 01/21/13 1355     01/21/13 1400  piperacillin-tazobactam (ZOSYN) IVPB 3.375 g  Status:  Discontinued     3.375 g 100 mL/hr over 30 Minutes Intravenous 3 times per day 01/21/13 1347 01/21/13 1352      Assessment/Plan: s/p * No surgery found *  Cystic liver mass  CT final results are pending.  Large cystic mass in the right liver lobe  seen on CT and ultrasound.  May be a malignancy.  Has gallstones, but no other signs of cholecystitis on ultrasound.  Checking HIDA scan. Anemia improved with transfusion. Will follow   LOS: 1 day    Whitney [name] A 01/22/2013

## 2013-01-22 NOTE — Progress Notes (Addendum)
Whitney [name] JYN:[id number] DOB: 07-12-1931 DOA: 01/21/2013 PCP: Milana ObeyKNOWLTON,STEPHEN D, MD  Brief narrative: 80 year old female patient with history of hypertension and dyslipidemia. [facility] of [facility], vomiting, diarrhea and profound weakness over 5 days. Patient reported to nausea and vomiting started first followed by the abdominal discomfort. Diarrhea had resolved prior to presentation. She felt profoundly weak and called EMS for transport. Upon arrival she was found to have hypotension with a blood pressure 70/44. With fluid administration patient's blood pressure stabilized but her EKG showed ST depressions in the anterior and inferior leads. Cardiology was consulted initially and felt the patient would [name]. [facility] she was evaluated by another cardiologist who performed a bedside echo that demonstrated no wall motion abnormalities and preserved LV function. Her IVC was small and somewhat collapse consistent with dehydration. Cardiology did not think the patient's symptoms were caused by cardiac for skin etiology and felt the patient likely had an intra-abdominal process given the finding of right upper quadrant abdominal pain on exam.  Assessment/Plan: Active Problems:   Subcapsular hemorrhage of liver/Transaminitis/ Abdominal pain, right upper quadrant -CT revealed hematoma with concerns of possible underlying mass; unfortunately current bleeding obscures any clear delineation if there is a mass underneath -will need followup imaging in several weeks after discharge -Symptom management re: pain -Follow lab    Hypotension -Multifactorial due to hypotension and some blood loss anemia -Has resolved so will resume home medications-see below -Decrease IV fluids to keep open      Lactic acidosis -Due to dehydration in recent hypotension    Nausea with vomiting -Unclear if patient  had viral gastroenteritis prior to presentation or if had spontaneous hemorrhage that led to nausea and vomiting -Appears resolved    Hypokalemia -Oral replete    Anemia, unspecified -Hemoglobin up to 9 after transfusion 2 units of packed red blood cells    Acute renal failure -Secondary to hypotension, acute blood loss anemia, and dehydration -Improving    Positive blood culture/leukocytosis -Preliminary for gram-positive cocci in clusters unfortunately only one bottle obtain so unable to clarify if skin contaminant or true result -Has already been started on antibiotics but as precaution we'll repeat blood cultures x2 sets -Continue empiric vancomycin until this clarified    Essential hypertension -Resume Tenormin and diltiazem -Hold ACE inhibitor until renal function improved    Hyperlipidemia    abnormal electrocardiogram (EKG) -Likely mild dementia ischemic changes related to hypotension and anemia -Cardiology evaluated in no acute issues have signed off    Thrombocytopenia, unspecified -Likely due to consumption - Follow   DVT prophylaxis: SCDs Code Status: Full Family Communication: No family at bedside Disposition Plan/Expected LOS: Transfer to floor   Consultants: Cardiology  Procedures: None  Antibiotics: Vancomycin 1/19 >>> Zosyn 1/19 >>>1/20  HPI/Subjective: Patient alert and sitting on side of bed.  Complaining of right upper quadrant abdominal pain. No further nausea or vomiting. No chest pain or shortness of breath.  Objective: Blood pressure 169/51, pulse 86, temperature 98.7 F (37.1 C), temperature source Oral, resp. rate 22, height 5\' 5"  (1.651 m), weight 216 lb 14.9 oz (98.4 kg), SpO2 99.00%.  Intake/Output Summary (Last 24 hours) at 01/22/13 1327 Last data filed at 01/22/13 0800  Gross per 24 hour  Intake 3772.5 ml  Output    250 ml  Net 3522.5 ml     Exam: General: No acute respiratory distress Lungs:  Clear to auscultation  bilaterally without wheezes or crackles, RA Cardiovascular: Regular rate and rhythm without murmur gallop or rub normal S1 and S2, no peripheral edema or JVD Abdomen: Somewhat tender right upper quadrant with minimal guarding- no rebounding, nondistended, soft, bowel sounds positive, no ascites, no appreciable mass Musculoskeletal: No significant cyanosis, clubbing of bilateral lower extremities Neurological: Alert and oriented x 3, moves all extremities x 4 without focal neurological deficits, CN 2-12 intact  Scheduled Meds:  Scheduled Meds: . atenolol  25 mg Oral Daily  . diltiazem  180 mg Oral Daily  . pantoprazole (PROTONIX) IV  40 mg Intravenous Q12H  . potassium chloride  40 mEq Oral BID  . sodium chloride  3 mL Intravenous Q12H  . vancomycin  750 mg Intravenous Q12H   Continuous Infusions: . sodium chloride 125 mL/hr at 01/21/13 1400    **Reviewed in detail by the Attending Physician  Data Reviewed: Basic Metabolic Panel:  Recent Labs Lab 01/21/13 1020 01/22/13 0145  NA 143 143  K 3.0* 3.6*  CL 101 105  CO2 26 26  GLUCOSE 160* 108*  BUN 27* 23  CREATININE 1.34* 1.02  CALCIUM 8.0* 7.5*   Liver Function Tests:  Recent Labs Lab 01/21/13 1020 01/22/13 0145  AST 102* 74*  ALT 228* 176*  ALKPHOS 62 64  BILITOT 1.1 1.1  PROT 5.7* 5.3*  ALBUMIN 3.1* 2.8*    Recent Labs Lab 01/21/13 1020  LIPASE 67*   No results found for this basename: AMMONIA,  in the last 168 hours CBC:  Recent Labs Lab 01/21/13 1020 01/21/13 1421 01/22/13 0145  WBC 14.5*  --  12.2*  NEUTROABS 9.9*  --   --   HGB 7.4* 7.4* 9.2*  HCT 23.6* 21.9* 26.8*  MCV 97.5  --  89.9  PLT 93*  --  95*   Cardiac Enzymes:  Recent Labs Lab 01/21/13 1020 01/21/13 1421 01/22/13 0145  TROPONINI <0.30 <0.30 <0.30   BNP (last 3 results) No results found for this basename: PROBNP,  in the last 8760 hours CBG: No results found for this basename: GLUCAP,  in the last 168 hours  Recent  Results (from the past 240 hour(s))  CULTURE, BLOOD (ROUTINE X 2)     Status: None   Collection Time    01/21/13 10:20 AM      Result Value Range Status   Specimen Description BLOOD LEFT IJ COLLECTED BY DOCTOR WARD   Final   Special Requests BOTTLES DRAWN AEROBIC AND ANAEROBIC 8CC   Final   Culture     Final   Value: GRAM POSITIVE COCCI IN CLUSTERS     Gram Stain Report Called to,Read Back By and Verified With: Franchot Gallo AT [facility] AT 0312 ON [id number] BY FORSYTH K     Performed at [facility]   Report Status PENDING   Incomplete  MRSA PCR SCREENING     Status: None   Collection Time    01/21/13 11:33 AM      Result Value Range Status   MRSA by PCR NEGATIVE  NEGATIVE Final   Comment:            The GeneXpert MRSA Assay (FDA     approved for NASAL specimens     only), is one component of a     comprehensive MRSA colonization     surveillance program. It is not     intended to diagnose MRSA     infection nor to guide  or     monitor treatment for     MRSA infections.     Studies:  Recent x-ray studies have been reviewed in detail by the Attending Physician  Time spent : 25 minutes     Junious Silk, ANP [facility] Office  [phone] Pager [phone]  **If unable to reach the above provider after paging please contact the Flow Manager @ [phone]  On-Call/Text Page:      [email or web address]      password [email or web address]  If 7PM-7AM, please contact night-coverage [email or web address] Password [email or web address] 01/22/2013, 1:27 PM   LOS: 1 day   I have examined the patient, reviewed the chart and modified the above note which I agree with.   RIZWAN,SAIMA,MD [phone] 01/22/2013, 6:11 PM

## 2013-01-23 LAB — URINE CULTURE
COLONY COUNT: NO GROWTH
Culture: NO GROWTH

## 2013-01-23 LAB — CBC
HCT: 27.4 % — ABNORMAL LOW (ref 36.0–46.0)
Hemoglobin: 9 g/dL — ABNORMAL LOW (ref 12.0–15.0)
MCH: 30.5 pg (ref 26.0–34.0)
MCHC: 32.8 g/dL (ref 30.0–36.0)
MCV: 92.9 fL (ref 78.0–100.0)
PLATELETS: 138 10*3/uL — AB (ref 150–400)
RBC: 2.95 MIL/uL — ABNORMAL LOW (ref 3.87–5.11)
RDW: 16.3 % — ABNORMAL HIGH (ref 11.5–15.5)
WBC: 11.8 10*3/uL — AB (ref 4.0–10.5)

## 2013-01-23 LAB — CULTURE, BLOOD (ROUTINE X 2)

## 2013-01-23 LAB — COMPREHENSIVE METABOLIC PANEL
ALT: 124 U/L — ABNORMAL HIGH (ref 0–35)
AST: 42 U/L — AB (ref 0–37)
Albumin: 2.7 g/dL — ABNORMAL LOW (ref 3.5–5.2)
Alkaline Phosphatase: 69 U/L (ref 39–117)
BUN: 12 mg/dL (ref 6–23)
CALCIUM: 8 mg/dL — AB (ref 8.4–10.5)
CHLORIDE: 106 meq/L (ref 96–112)
CO2: 23 mEq/L (ref 19–32)
CREATININE: 0.77 mg/dL (ref 0.50–1.10)
GFR calc Af Amer: >90 mL/min (ref >90)
GFR calc non Af Amer: 78 mL/min — ABNORMAL LOW (ref >90)
Glucose, Bld: 101 mg/dL — ABNORMAL HIGH (ref 70–99)
Potassium: 4.1 mEq/L (ref 3.7–5.3)
Sodium: 141 mEq/L (ref 137–147)
TOTAL PROTEIN: 5.5 g/dL — AB (ref 6.0–8.3)
Total Bilirubin: 1 mg/dL (ref 0.3–1.2)

## 2013-01-23 LAB — AFP TUMOR MARKER: AFP-Tumor Marker: 9 ng/mL — ABNORMAL HIGH (ref 0.0–8.0)

## 2013-01-23 MED ORDER — FERROUS SULFATE 325 (65 FE) MG PO TABS
325.0000 mg | ORAL_TABLET | Freq: Every day | ORAL | Status: DC
Start: 2013-01-23 — End: 2013-01-26
  Administered 2013-01-23 – 2013-01-26 (×3): 325 mg via ORAL
  Filled 2013-01-23 (×5): qty 1

## 2013-01-23 MED ORDER — PANTOPRAZOLE SODIUM 40 MG PO TBEC
40.0000 mg | DELAYED_RELEASE_TABLET | Freq: Two times a day (BID) | ORAL | Status: DC
  Administered 2013-01-23 – 2013-01-26 (×6): 40 mg via ORAL
  Filled 2013-01-23 (×5): qty 1

## 2013-01-23 MED ORDER — FERROUS SULFATE 27 MG PO TABS: 1.0000 | ORAL_TABLET | Freq: Every day | ORAL | Status: DC

## 2013-01-23 MED ORDER — POTASSIUM CHLORIDE CRYS ER 20 MEQ PO TBCR
20.0000 meq | EXTENDED_RELEASE_TABLET | Freq: Every day | ORAL | Status: DC
  Administered 2013-01-23 – 2013-01-26 (×4): 20 meq via ORAL
  Filled 2013-01-23 (×3): qty 1

## 2013-01-23 MED ORDER — LISINOPRIL 20 MG PO TABS
20.0000 mg | ORAL_TABLET | Freq: Every day | ORAL | Status: DC
  Administered 2013-01-23 – 2013-01-26 (×4): 20 mg via ORAL
  Filled 2013-01-23 (×4): qty 1

## 2013-01-23 NOTE — Progress Notes (Signed)
Subjective: Pt still reporting RUQ abdominal pain  Objective: Vital signs in last 24 hours: Temp:  [98.6 F (37 C)-98.7 F (37.1 C)] 98.7 F (37.1 C) (01/21 0351) Pulse Rate:  [70-86] 79 (01/20 1400) Resp:  [0-28] 22 (01/21 0351) BP: (140-176)/(33-79) 152/41 mmHg (01/21 0351) SpO2:  [92 %-100 %] 96 % (01/21 0351) Last BM Date: 01/22/13  Intake/Output from previous day: 01/20 0701 - 01/21 0700 In: 2160 [P.O.:860; I.V.:1000; IV Piggyback:300] Out: 900 [Urine:900] Intake/Output this shift:    Mild to moderate RUQ abdominal tenderness on exam Rest of abdomen soft  Lab Results:   Recent Labs  01/22/13 0145 01/23/13 0305  WBC 12.2* 11.8*  HGB 9.2* 9.0*  HCT 26.8* 27.4*  PLT 95* 138*   BMET  Recent Labs  01/22/13 0145 01/23/13 0305  NA 143 141  K 3.6* 4.1  CL 105 106  CO2 26 23  GLUCOSE 108* 101*  BUN 23 12  CREATININE 1.02 0.77  CALCIUM 7.5* 8.0*   PT/INR  Recent Labs  01/21/13 1020  LABPROT 14.7  INR 1.17   ABG No results found for this basename: PHART, PCO2, PO2, HCO3,  in the last 72 hours  Studies/Results: Ct Abdomen Pelvis W Wo Contrast  01/22/2013   CLINICAL DATA:  Liver mass on ultrasound.  Abdominal pain.  EXAM: CT ABDOMEN AND PELVIS WITHOUT AND WITH CONTRAST  TECHNIQUE: Multidetector CT imaging of the abdomen and pelvis was performed without contrast material in one or both body regions, followed by contrast material(s) and further sections in one or both body regions.  CONTRAST:  80mL OMNIPAQUE IOHEXOL 300 MG/ML  SOLN  COMPARISON:  Abdominal ultrasound dated 01/21/2013  FINDINGS: Trace right pleural effusion.  14.3 x 7.6 x 14.0 cm mixed density subcapsular hematoma along the lateral aspect of the right liver (series 2/image 33). Additional associated mixed density hemorrhage along the posterior aspect of the right hepatic lobe (series 2/image 15) and along the right hepatic dome (series 2/image 10). This appearance is worrisome for an underlying  mass, possibly along the hepatic dome (series 10/image 48), although the mass (if present) is obscured by hemorrhage.  Following contrast administration, there are no findings suspicious for active extravasation.  Spleen, pancreas, and adrenal glands are within normal limits.  3.1 cm gallstone (series 13/image 64). No associated inflammatory changes. No intrahepatic or extrahepatic ductal dilatation.  Kidneys are within normal limits.  No hydronephrosis.  No evidence of bowel obstruction. Duodenal diverticulum (series 3/image 58). Normal appendix. Sigmoid diverticulosis, without associated inflammatory changes.  Atherosclerotic calcifications of the abdominal aorta and branch vessels.  Small volume hemorrhage along the inferior liver, right paracolic gutter, and pelvis.  No suspicious abdominopelvic lymphadenopathy.  Status post hysterectomy.  No adnexal masses.  Bladder is within normal limits.  Degenerative changes of the visualized thoracolumbar spine.  IMPRESSION: 14.3 cm mixed density subcapsular hematoma along the lateral aspect of the right liver. Associated mixed density hemorrhage along the right hepatic dome and posterior aspect of the right hepatic lobe. No evidence of active extravasation.  This appearance is worrisome for an underlying mass, possibly along the hepatic dome, although the mass (if present) is obscured by hemorrhage. Consider follow-up MRI abdomen with/without contrast when hematoma has improved/resolved.  Cholelithiasis, without associated findings to suggest acute cholecystitis.  These results were called by telephone at the time of interpretation on 01/22/2013 at 8:38 AM to Dr. Butler Denmarkizwan, who verbally acknowledged these results.   Electronically Signed   By: Roselie AwkwardSriyesh  Krishnan M.D.  On: 01/22/2013 08:39   US Abdomen Complete  01/21/2013   CLINICAL DATA:  Right upper quadrant pain  EXAM: ULTRASOUND ABDOMEN COMPLETE  COMPARISON:  None.  FINDINGS: Gallbladder:  Gallstone stone within the  gallbladder. No sonographic Murphy sign noted.  Common bile duct:  Diameter: 4.3 mm  Liver:  There is a complex cystic lesion in the liver measuring 13.4 x 6.4 x 12.5 cm.  IVC:  No abnormality visualized.  Pancreas:  Visualized portion unremarkable.  Spleen:  Size and appearance within normal limits.  Right Kidney:  Length: 11.3 cm. Echogenicity within normal limits. No mass or hydronephrosis visualized.  Left Kidney:  Length: 11.2 cm. Echogenicity within normal limits. No mass or hydronephrosis visualized.  Abdominal aorta:  No aneurysm visualized.  Bifurcation not seen  Other findings:  Perisplenic ascites.  IMPRESSION: There is a complex cystic lesion in the liver measuring 13.4 x 6.4 x 12.5 cm. Further evaluation with three-phase liver CT is recommended.  Cholelithiasis without sonographic evidence of acute cholecystitis.   Electronically Signed   By: Sherian Rein M.D.   On: 01/21/2013 15:59    Anti-infectives: Anti-infectives   Start     Dose/Rate Route Frequency Ordered Stop   01/22/13 1500  vancomycin (VANCOCIN) IVPB 750 mg/150 ml premix     750 mg 150 mL/hr over 60 Minutes Intravenous Every 12 hours 01/22/13 0915     01/22/13 0300  vancomycin (VANCOCIN) IVPB 750 mg/150 ml premix  Status:  Discontinued     750 mg 150 mL/hr over 60 Minutes Intravenous Every 12 hours 01/21/13 1410 01/22/13 0841   01/21/13 1500  vancomycin (VANCOCIN) 1,500 mg in sodium chloride 0.9 % 500 mL IVPB     1,500 mg 250 mL/hr over 120 Minutes Intravenous  Once 01/21/13 1410 01/21/13 2120   01/21/13 1430  piperacillin-tazobactam (ZOSYN) IVPB 3.375 g  Status:  Discontinued     3.375 g 12.5 mL/hr over 240 Minutes Intravenous 3 times per day 01/21/13 1355 01/22/13 0841   01/21/13 1400  piperacillin-tazobactam (ZOSYN) IVPB 3.375 g  Status:  Discontinued     3.375 g 100 mL/hr over 30 Minutes Intravenous 3 times per day 01/21/13 1347 01/21/13 1352      Assessment/Plan: s/p * No surgery found *  Liver hematoma of  uncertain etiology.  Could be spontaneous or secondary to a mass.  Radiology has recommended an MRI in a few weeks once the hematoma has started resolving. No evidence of cholecystitis on ultrasound or CT.  I believe that we can hold on a HIDA scan as I do not believe she has cholecystitis.  I think her diet can be advanced.  LOS: 2 days    Elyjah Hazan A 01/23/2013

## 2013-01-23 NOTE — Progress Notes (Signed)
Team 1/Stepdown ICU Progress Note    Whitney [name] ACZ:[id number] DOB: Feb 23, 1931 DOA: 01/21/2013 PCP: Milana Obey, MD  Brief narrative: 80 year old female patient with history of hypertension and dyslipidemia. Presented to [facility] with complaints of nausea, vomiting, diarrhea and profound weakness over 5 days. She felt profoundly weak and called EMS for transport. Upon arrival she was found to have a blood pressure 70/44. Her EKG showed ST depressions in the anterior and inferior leads. Cardiology was consulted and felt the patient would benefit from transfer to [facility]. Upon arrival to [facility] she was evaluated by another Cardiologist who performed a bedside echo that demonstrated no wall motion abnormalities and preserved LV function. Cardiology did not think the patient's symptoms were caused by cardiac etiology.  Assessment/Plan:  Subcapsular hemorrhage of liver -CT revealed hematoma with concerns of possible underlying mass; unfortunately current bleeding obscures clear delineation -will need followup imaging in several weeks after discharge -Symptom management re: pain -Follow labs  Hypotension -Multifactorial due to hypovolemia and some blood loss anemia -Has resolved so will resume home medications-see below -Decrease IV fluids to keep open    Lactic acidosis -Due to dehydration in recent hypotension  Nausea with vomiting -Unclear if patient had viral gastroenteritis prior to presentation or if had spontaneous hemorrhage that led to nausea and vomiting -resolved  Hypokalemia -Oral replete  Anemia, unspecified -Hemoglobin up to 9 after transfusion 2 units of packed red blood cells  Acute renal failure -Secondary to hypotension, acute blood loss anemia, and dehydration -Improving  Positive blood culture/leukocytosis -ED blood cx + for coag neg staph in clusters unfortunately only one bottle obtain so unable to clarify if skin  contaminant or true result -Had already been started on antibiotics -repeat blood cultures x2 sets NGTD -will ask ID to help clarify if truly needs anbx and if so duration of rxn/add'l WU?? -Continue empiric vancomycin until this clarified  Essential hypertension -Cont Tenormin and diltiazem -Resume ACE inhibitor but continue to hold Lasix for now  Hyperlipidemia  abnormal electrocardiogram  -Likely mild demand ischemic changes related to hypotension and anemia -Cardiology evaluated and no acute issues - have signed off  Thrombocytopenia, unspecified -Likely due to consumption -Follow  DVT prophylaxis: SCDs Code Status: Full Family Communication: Dtr at bedside Disposition Plan/Expected LOS: Transfer to floor   Consultants: Cardiology  Procedures: None  Antibiotics: Vancomycin 1/19 >>> Zosyn 1/19 >>>1/20  HPI/Subjective: No new complaints today.  Complaining of right upper quadrant abdominal pain. No further nausea or vomiting.   Objective: Blood pressure 164/55, pulse 79, temperature 99 F (37.2 C), temperature source Oral, resp. rate 30, height 5\' 5"  (1.651 m), weight 216 lb 14.9 oz (98.4 kg), SpO2 95.00%.  Intake/Output Summary (Last 24 hours) at 01/23/13 1410 Last data filed at 01/23/13 0400  Gross per 24 hour  Intake    910 ml  Output    500 ml  Net    410 ml   Exam: General: No acute respiratory distress Lungs: Clear to auscultation bilaterally without wheezes or crackles, RA Cardiovascular: Regular rate and rhythm without murmur gallop or rub normal S1 and S2, no peripheral edema  Abdomen: Less tender right upper quadrant with no guarding- no rebounding, nondistended, soft, bowel sounds positive, no ascites, no appreciable mass Musculoskeletal: No significant cyanosis, clubbing of bilateral lower extremities Neurological: Alert and oriented x 3, moves all extremities x 4 without focal neurological deficits  Scheduled Meds:  Scheduled Meds: . atenolol   25  mg Oral Daily  . diltiazem  180 mg Oral Daily  . pantoprazole (PROTONIX) IV  40 mg Intravenous Q12H  . potassium chloride  40 mEq Oral BID  . sodium chloride  3 mL Intravenous Q12H   Data Reviewed: Basic Metabolic Panel:  Recent Labs Lab 01/21/13 1020 01/22/13 0145 01/23/13 0305  NA 143 143 141  K 3.0* 3.6* 4.1  CL 101 105 106  CO2 26 26 23   GLUCOSE 160* 108* 101*  BUN 27* 23 12  CREATININE 1.34* 1.02 0.77  CALCIUM 8.0* 7.5* 8.0*   Liver Function Tests:  Recent Labs Lab 01/21/13 1020 01/22/13 0145 01/23/13 0305  AST 102* 74* 42*  ALT 228* 176* 124*  ALKPHOS 62 64 69  BILITOT 1.1 1.1 1.0  PROT 5.7* 5.3* 5.5*  ALBUMIN 3.1* 2.8* 2.7*   CBC:  Recent Labs Lab 01/21/13 1020 01/21/13 1421 01/22/13 0145 01/23/13 0305  WBC 14.5*  --  12.2* 11.8*  NEUTROABS 9.9*  --   --   --   HGB 7.4* 7.4* 9.2* 9.0*  HCT 23.6* 21.9* 26.8* 27.4*  MCV 97.5  --  89.9 92.9  PLT 93*  --  95* 138*   Cardiac Enzymes:  Recent Labs Lab 01/21/13 1020 01/21/13 1421 01/22/13 0145  TROPONINI <0.30 <0.30 <0.30    Recent Results (from the past 240 hour(s))  CULTURE, BLOOD (ROUTINE X 2)     Status: None   Collection Time    01/21/13 10:20 AM      Result Value Range Status   Specimen Description BLOOD LEFT IJ COLLECTED BY DOCTOR WARD   Final   Special Requests BOTTLES DRAWN AEROBIC AND ANAEROBIC 8CC   Final   Culture  Setup Time     Final   Value: 01/22/2013 02:55     Performed at [facility]   Culture     Final   Value: STAPHYLOCOCCUS SPECIES (COAGULASE NEGATIVE)     Note: THE SIGNIFICANCE OF ISOLATING THIS ORGANISM FROM A SINGLE VENIPUNCTURE CANNOT BE PREDICTED WITHOUT FURTHER CLINICAL AND CULTURE CORRELATION. SUSCEPTIBILITIES AVAILABLE ONLY ON REQUEST.     Note: Gram Stain Report Called to,Read Back By and Verified With: Franchot GalloNINON M AT [facility] [id number] 01/22/13 BY Berton LanFORSYTH K     Performed at [facility]   Report Status 01/23/2013 FINAL   Final  MRSA PCR SCREENING      Status: None   Collection Time    01/21/13 11:33 AM      Result Value Range Status   MRSA by PCR NEGATIVE  NEGATIVE Final   Comment:            The GeneXpert MRSA Assay (FDA     approved for NASAL specimens     only), is one component of a     comprehensive MRSA colonization     surveillance program. It is not     intended to diagnose MRSA     infection nor to guide or     monitor treatment for     MRSA infections.  URINE CULTURE     Status: None   Collection Time    01/21/13 10:37 PM      Result Value Range Status   Specimen Description URINE, CLEAN CATCH   Final   Special Requests NONE   Final   Culture  Setup Time     Final   Value: 01/21/2013 23:47     Performed at [facility]   Colony Count  Final   Value: NO GROWTH     Performed at [facility]   Culture     Final   Value: NO GROWTH     Performed at [facility]   Report Status 01/23/2013 FINAL   Final  CULTURE, BLOOD (ROUTINE X 2)     Status: None   Collection Time    01/22/13  9:10 AM      Result Value Range Status   Specimen Description BLOOD RIGHT ARM   Final   Special Requests BOTTLES DRAWN AEROBIC AND ANAEROBIC 10CC   Final   Culture  Setup Time     Final   Value: 01/22/2013 13:30     Performed at [facility]   Culture     Final   Value:        BLOOD CULTURE RECEIVED NO GROWTH TO DATE CULTURE WILL BE HELD FOR 5 DAYS BEFORE ISSUING A FINAL NEGATIVE REPORT     Performed at [facility]   Report Status PENDING   Incomplete  CULTURE, BLOOD (ROUTINE X 2)     Status: None   Collection Time    01/22/13  9:25 AM      Result Value Range Status   Specimen Description BLOOD RIGHT HAND   Final   Special Requests BOTTLES DRAWN AEROBIC AND ANAEROBIC 10CC   Final   Culture  Setup Time     Final   Value: 01/22/2013 13:32     Performed at [facility]   Culture     Final   Value:        BLOOD CULTURE RECEIVED NO GROWTH TO DATE CULTURE WILL BE HELD FOR 5 DAYS BEFORE  ISSUING A FINAL NEGATIVE REPORT     Performed at [facility]   Report Status PENDING   Incomplete     Studies:  Recent x-ray studies have been reviewed in detail by the Attending Physician  Time spent : 35 minutes     Junious Silk, ANP [facility] Office  [phone] Pager [phone]  **If unable to reach the above provider after paging please contact the Flow Manager @ [phone]  On-Call/Text Page:      [email or web address]      password [email or web address]  If 7PM-7AM, please contact night-coverage [email or web address] Password [email or web address] 01/23/2013, 2:10 PM   LOS: 2 days   I have personally examined this patient and reviewed the entire database. I have reviewed the above note, made any necessary editorial changes, and agree with its content.  Lonia Blood, MD [facility]

## 2013-01-23 NOTE — Consult Note (Addendum)
[facility]  Total days of antibiotics 3        Day 3 vanco        (1 day of piptazo)       Reason for Consult: CoNS bacteremia    Referring Physician: Thereasa Solo  Active Problems:   Essential hypertension   Hyperlipidemia   Hypotension   Transaminitis   Nausea with vomiting   Abdominal pain, right upper quadrant   abnormal electrocardiogram (EKG)   Anemia, unspecified   Lactic acidosis   Thrombocytopenia, unspecified   Subcapsular hemorrhage of liver   Acute renal failure   Positive blood culture    HPI: Whitney [name] is a 80 y.o. female with past med history of HTN, HLD who reported initially to [facility] for severe weakness. Prior to her hospitalization, she reports having 4-5 day history of N/V/D with fevers/chills where diarrhea resolved prior to admit but still having weakness. During this time period of being very ill, she reports eating very little food or drink, unable to keep down due to vomiting. She denies that the abdominal pain got worse when she ate her meals. EMS found to be hypotensive with sBP 70/40-90/48, mildy elevated LA, elevated WBC in 14K, and LA of 4.1. EKG showed ST depression in anterior and inferior leads. TTE showed hyperdynamic EF, and signs concerning for hypovolemica. SHe was fluid rescucitated and started on broad spectrum antibiotics due to concern that her GI symptoms were possibly due to cholecystitis with N/V and RUQ pain noted on exam vs. Pancreatitis since lipase mildly elevated. Her chemistry did reveal mild transaminitis with ALT 228 and AST 102 but bilirubin and ALP normal. abd u/s only showed evidence of cholelithiasis but no obstruction. Imaging did reveal a complex hepatic structure measured 14.3 x 7.6 x 14 on abd CT that was a  subcapsular hematoma along the  lateral aspect of the right liver . Additional associated mixed density hemorrhage along the posterior aspect of the right hepatic lobe  and along the right  hepatic dome . This appearance is worrisome for an underlying mass, possibly along the hepatic dome , although the mass  is obscured by hemorrhage. Her infectious work-up included blood cx and urine cx. Thus far blood cx + CoNS. Patient still feels weak, poor po intake but abdominal pain somewhat improved Primary team asked ID consultation to see what to do next in management of 1 blood cx revealing CoNS and could it account for the patient's clinical presentation.  The patient appears to have much improved abdominal pain. lfts are starting to trend down.   Past Medical History  Diagnosis Date  . Hyperlipidemia   . Hypertension     Allergies: No Known Allergies   MEDICATIONS: . atenolol  25 mg Oral Daily  . diltiazem  180 mg Oral Daily  . lisinopril  20 mg Oral Daily  . pantoprazole (PROTONIX) IV  40 mg Intravenous Q12H  . potassium chloride  40 mEq Oral BID  . sodium chloride  3 mL Intravenous Q12H    History  Substance Use Topics  . Smoking status: Never Smoker   . Smokeless tobacco: Never Used  . Alcohol Use: No    Family History  Problem Relation Age of Onset  . Diabetes Child      Review of Systems  Constitutional: Negative for fever, chills, diaphoresis, activity change, appetite change, fatigue and unexpected weight change.  HENT: Negative for congestion, sore throat, rhinorrhea, sneezing, trouble swallowing  and sinus pressure.  Eyes: Negative for photophobia and visual disturbance.  Respiratory: Negative for cough, chest tightness, shortness of breath, wheezing and stridor.  Cardiovascular: Negative for chest pain, palpitations and leg swelling.  Gastrointestinal: positive for nausea, vomiting, abdominal pain, diarrhea, but not constipation, blood in stool, abdominal distention and anal bleeding.  Genitourinary: Negative for dysuria, hematuria, flank pain and difficulty urinating.  Musculoskeletal: Negative for myalgias, back pain, joint swelling, arthralgias and  gait problem.  Skin: Negative for color change, pallor, rash and wound.  Neurological: Negative for dizziness, tremors, weakness and light-headedness.  Hematological: Negative for adenopathy. Does not bruise/bleed easily.  Psychiatric/Behavioral: Negative for behavioral problems, confusion, sleep disturbance, dysphoric mood, decreased concentration and agitation.     OBJECTIVE: Temp:  [98.6 F (37 C)-99 F (37.2 C)] 99 F (37.2 C) (01/21 1200) Resp:  [0-30] 30 (01/21 1200) BP: (152-176)/(41-63) 164/55 mmHg (01/21 1200) SpO2:  [95 %-97 %] 95 % (01/21 1200) Physical Exam  Constitutional:  oriented to person, place, and time. appears fatigued. No distress.  HENT:  Mouth/Throat: Oropharynx is clear and moist. No oropharyngeal exudate.  Cardiovascular: Normal rate, regular rhythm and normal heart sounds. Exam reveals no gallop and no friction rub.  No murmur heard.  Pulmonary/Chest: Effort normal and breath sounds normal. No respiratory distress. has no wheezes.  Abdominal: Soft. Bowel sounds are normal. exhibits no distension. Mild RUQ tenderness with mild palpation Lymphadenopathy:  no cervical adenopathy.  Neurological:  alert and oriented to person, place, and time.  Skin: Skin is warm and dry. No rash noted. No erythema.  Psychiatric: a normal mood and affect.  behavior is normal.    LABS: Results for orders placed during the hospital encounter of 01/21/13 (from the past 48 hour(s))  URINALYSIS, ROUTINE W REFLEX MICROSCOPIC     Status: Abnormal   Collection Time    01/21/13 10:37 PM      Result Value Range   Color, Urine AMBER (*) YELLOW   Comment: BIOCHEMICALS MAY BE AFFECTED BY COLOR   APPearance CLOUDY (*) CLEAR   Specific Gravity, Urine 1.030  1.005 - 1.030   pH 5.5  5.0 - 8.0   Glucose, UA NEGATIVE  NEGATIVE mg/dL   Hgb urine dipstick NEGATIVE  NEGATIVE   Bilirubin Urine SMALL (*) NEGATIVE   Ketones, ur 15 (*) NEGATIVE mg/dL   Protein, ur NEGATIVE  NEGATIVE mg/dL    Urobilinogen, UA 1.0  0.0 - 1.0 mg/dL   Nitrite NEGATIVE  NEGATIVE   Leukocytes, UA NEGATIVE  NEGATIVE   Comment: MICROSCOPIC NOT DONE ON URINES WITH NEGATIVE PROTEIN, BLOOD, LEUKOCYTES, NITRITE, OR GLUCOSE <1000 mg/dL.  URINE CULTURE     Status: None   Collection Time    01/21/13 10:37 PM      Result Value Range   Specimen Description URINE, CLEAN CATCH     Special Requests NONE     Culture  Setup Time       Value: 01/21/2013 23:47     Performed at [facility]       Value: NO GROWTH     Performed at [facility]   Culture       Value: NO GROWTH     Performed at [facility]   Report Status 01/23/2013 FINAL    TROPONIN I     Status: None   Collection Time    01/22/13  1:45 AM      Result Value Range   Troponin  I <0.30  <0.30 ng/mL   Comment:            Due to the release kinetics of cTnI,     a negative result within the first hours     of the onset of symptoms does not rule out     myocardial infarction with certainty.     If myocardial infarction is still suspected,     repeat the test at appropriate intervals.  COMPREHENSIVE METABOLIC PANEL     Status: Abnormal   Collection Time    01/22/13  1:45 AM      Result Value Range   Sodium 143  137 - 147 mEq/L   Potassium 3.6 (*) 3.7 - 5.3 mEq/L   Chloride 105  96 - 112 mEq/L   CO2 26  19 - 32 mEq/L   Glucose, Bld 108 (*) 70 - 99 mg/dL   BUN 23  6 - 23 mg/dL   Creatinine, Ser 1.02  0.50 - 1.10 mg/dL   Calcium 7.5 (*) 8.4 - 10.5 mg/dL   Total Protein 5.3 (*) 6.0 - 8.3 g/dL   Albumin 2.8 (*) 3.5 - 5.2 g/dL   AST 74 (*) 0 - 37 U/L   ALT 176 (*) 0 - 35 U/L   Alkaline Phosphatase 64  39 - 117 U/L   Total Bilirubin 1.1  0.3 - 1.2 mg/dL   GFR calc non Af Amer 51 (*) >90 mL/min   GFR calc Af Amer 59 (*) >90 mL/min   Comment: (NOTE)     The eGFR has been calculated using the CKD EPI equation.     This calculation has not been validated in all clinical situations.     eGFR's persistently  <90 mL/min signify possible Chronic Kidney     Disease.  LACTIC ACID, PLASMA     Status: None   Collection Time    01/22/13  1:45 AM      Result Value Range   Lactic Acid, Venous 1.1  0.5 - 2.2 mmol/L  CBC     Status: Abnormal   Collection Time    01/22/13  1:45 AM      Result Value Range   WBC 12.2 (*) 4.0 - 10.5 K/uL   RBC 2.98 (*) 3.87 - 5.11 MIL/uL   Hemoglobin 9.2 (*) 12.0 - 15.0 g/dL   Comment: POST TRANSFUSION SPECIMEN     REPEATED TO VERIFY   HCT 26.8 (*) 36.0 - 46.0 %   MCV 89.9  78.0 - 100.0 fL   MCH 30.9  26.0 - 34.0 pg   MCHC 34.3  30.0 - 36.0 g/dL   RDW 15.9 (*) 11.5 - 15.5 %   Platelets 95 (*) 150 - 400 K/uL   Comment: SPECIMEN CHECKED FOR CLOTS     REPEATED TO VERIFY     PLATELET COUNT CONFIRMED BY SMEAR  OCCULT BLOOD X 1 CARD TO LAB, STOOL     Status: None   Collection Time    01/22/13  2:30 AM      Result Value Range   Fecal Occult Bld NEGATIVE  NEGATIVE  CULTURE, BLOOD (ROUTINE X 2)     Status: None   Collection Time    01/22/13  9:10 AM      Result Value Range   Specimen Description BLOOD RIGHT ARM     Special Requests BOTTLES DRAWN AEROBIC AND ANAEROBIC 10CC     Culture  Setup Time       Value:  01/22/2013 13:30     Performed at [facility]   Culture       Value:        BLOOD CULTURE RECEIVED NO GROWTH TO DATE CULTURE WILL BE HELD FOR 5 DAYS BEFORE ISSUING A FINAL NEGATIVE REPORT     Performed at [facility]   Report Status PENDING    CULTURE, BLOOD (ROUTINE X 2)     Status: None   Collection Time    01/22/13  9:25 AM      Result Value Range   Specimen Description BLOOD RIGHT HAND     Special Requests BOTTLES DRAWN AEROBIC AND ANAEROBIC 10CC     Culture  Setup Time       Value: 01/22/2013 13:32     Performed at [facility]   Culture       Value:        BLOOD CULTURE RECEIVED NO GROWTH TO DATE CULTURE WILL BE HELD FOR 5 DAYS BEFORE ISSUING A FINAL NEGATIVE REPORT     Performed at [facility]   Report Status  PENDING    AFP TUMOR MARKER     Status: Abnormal   Collection Time    01/22/13 12:05 PM      Result Value Range   AFP-Tumor Marker 9.0 (*) 0.0 - 8.0 ng/mL   Comment: (NOTE)     The [employer]ia Centaur AFP immunoassay method is used.  Results obtained     with different assay methods or kits cannot be used interchangeably.     AFP is a valuable aid in the management of nonseminomatous testicular     cancer patients when used in conjunction with information available     from the clinical evaluation and other diagnostic procedures.     Increased serum AFP concentrations have also been observed in ataxia     telangiectasia, hereditary tyrosinemia, primary hepatocellular     carcinoma, teratocarcinoma, gastrointestinal tract cancers with and     without liver metastases, and in benign hepatic conditions such as     acute viral hepatitis, chronic active hepatitis, and cirrhosis.  This     result cannot be interpreted as absolute evidence of the presence or     absence of malignant disease.  This result is not interpretable in     pregnant females.     Performed at [facility]  CBC     Status: Abnormal   Collection Time    01/23/13  3:05 AM      Result Value Range   WBC 11.8 (*) 4.0 - 10.5 K/uL   RBC 2.95 (*) 3.87 - 5.11 MIL/uL   Hemoglobin 9.0 (*) 12.0 - 15.0 g/dL   HCT 27.4 (*) 36.0 - 46.0 %   MCV 92.9  78.0 - 100.0 fL   MCH 30.5  26.0 - 34.0 pg   MCHC 32.8  30.0 - 36.0 g/dL   RDW 16.3 (*) 11.5 - 15.5 %   Platelets 138 (*) 150 - 400 K/uL   Comment: DELTA CHECK NOTED     REPEATED TO VERIFY  COMPREHENSIVE METABOLIC PANEL     Status: Abnormal   Collection Time    01/23/13  3:05 AM      Result Value Range   Sodium 141  137 - 147 mEq/L   Potassium 4.1  3.7 - 5.3 mEq/L   Chloride 106  96 - 112 mEq/L   CO2 23  19 - 32 mEq/L  Glucose, Bld 101 (*) 70 - 99 mg/dL   BUN 12  6 - 23 mg/dL   Comment: DELTA CHECK NOTED   Creatinine, Ser 0.77  0.50 - 1.10 mg/dL   Calcium 8.0 (*) 8.4 -  10.5 mg/dL   Total Protein 5.5 (*) 6.0 - 8.3 g/dL   Albumin 2.7 (*) 3.5 - 5.2 g/dL   AST 42 (*) 0 - 37 U/L   ALT 124 (*) 0 - 35 U/L   Alkaline Phosphatase 69  39 - 117 U/L   Total Bilirubin 1.0  0.3 - 1.2 mg/dL   GFR calc non Af Amer 78 (*) >90 mL/min   GFR calc Af Amer >90  >90 mL/min   Comment: (NOTE)     The eGFR has been calculated using the CKD EPI equation.     This calculation has not been validated in all clinical situations.     eGFR's persistently <90 mL/min signify possible Chronic Kidney     Disease.    MICRO: 1/19 blood cx CoNS 1/19 urine cx NGTD 1/20 blood cx NGTD IMAGING: Ct Abdomen Pelvis W Wo Contrast  01/22/2013   CLINICAL DATA:  Liver mass on ultrasound.  Abdominal pain.  EXAM: CT ABDOMEN AND PELVIS WITHOUT AND WITH CONTRAST  TECHNIQUE: Multidetector CT imaging of the abdomen and pelvis was performed without contrast material in one or both body regions, followed by contrast material(s) and further sections in one or both body regions.  CONTRAST:  67m OMNIPAQUE IOHEXOL 300 MG/ML  SOLN  COMPARISON:  Abdominal ultrasound dated 01/21/2013  FINDINGS: Trace right pleural effusion.  14.3 x 7.6 x 14.0 cm mixed density subcapsular hematoma along the lateral aspect of the right liver (series 2/image 33). Additional associated mixed density hemorrhage along the posterior aspect of the right hepatic lobe (series 2/image 15) and along the right hepatic dome (series 2/image 10). This appearance is worrisome for an underlying mass, possibly along the hepatic dome (series 10/image 48), although the mass (if present) is obscured by hemorrhage.  Following contrast administration, there are no findings suspicious for active extravasation.  Spleen, pancreas, and adrenal glands are within normal limits.  3.1 cm gallstone (series 13/image 64). No associated inflammatory changes. No intrahepatic or extrahepatic ductal dilatation.  Kidneys are within normal limits.  No hydronephrosis.  No evidence  of bowel obstruction. Duodenal diverticulum (series 3/image 58). Normal appendix. Sigmoid diverticulosis, without associated inflammatory changes.  Atherosclerotic calcifications of the abdominal aorta and branch vessels.  Small volume hemorrhage along the inferior liver, right paracolic gutter, and pelvis.  No suspicious abdominopelvic lymphadenopathy.  Status post hysterectomy.  No adnexal masses.  Bladder is within normal limits.  Degenerative changes of the visualized thoracolumbar spine.  IMPRESSION: 14.3 cm mixed density subcapsular hematoma along the lateral aspect of the right liver. Associated mixed density hemorrhage along the right hepatic dome and posterior aspect of the right hepatic lobe. No evidence of active extravasation.  This appearance is worrisome for an underlying mass, possibly along the hepatic dome, although the mass (if present) is obscured by hemorrhage. Consider follow-up MRI abdomen with/without contrast when hematoma has improved/resolved.  Cholelithiasis, without associated findings to suggest acute cholecystitis.  These results were called by telephone at the time of interpretation on 01/22/2013 at 8:38 AM to Dr. RWynelle Cleveland who verbally acknowledged these results.   Electronically Signed   By: SJulian HyM.D.   On: 01/22/2013 08:39   [place]domen Complete  01/21/2013   CLINICAL DATA:  Right upper  quadrant pain  EXAM: ULTRASOUND ABDOMEN COMPLETE  COMPARISON:  None.  FINDINGS: Gallbladder:  Gallstone stone within the gallbladder. No sonographic Murphy sign noted.  Common bile duct:  Diameter: 4.3 mm  Liver:  There is a complex cystic lesion in the liver measuring 13.4 x 6.4 x 12.5 cm.  IVC:  No abnormality visualized.  Pancreas:  Visualized portion unremarkable.  Spleen:  Size and appearance within normal limits.  Right Kidney:  Length: 11.3 cm. Echogenicity within normal limits. No mass or hydronephrosis visualized.  Left Kidney:  Length: 11.2 cm. Echogenicity within normal limits.  No mass or hydronephrosis visualized.  Abdominal aorta:  No aneurysm visualized.  Bifurcation not seen  Other findings:  Perisplenic ascites.  IMPRESSION: There is a complex cystic lesion in the liver measuring 13.4 x 6.4 x 12.5 cm. Further evaluation with three-phase liver CT is recommended.  Cholelithiasis without sonographic evidence of acute cholecystitis.   Electronically Signed   By: Abelardo Diesel M.D.   On: 01/21/2013 15:59   Assessment/Plan:  80yo F presents with 4 day history of N/V/D that appears to be likely viral gastroenteritis and found to complex hepatic subcapsular hematoma +/- mass currently on vancomycin for CoNS bacteremia vs. Contaminant.  - recommend to stop IV vancomycin and repeat blood cx in 48hrs off of antibiotics.   [facility] [phone]   -

## 2013-01-23 NOTE — Progress Notes (Signed)
ANTIBIOTIC CONSULT NOTE   Pharmacy Consult for vancomycin/zosyn Indication: empiric coverage  No Known Allergies  Patient Measurements: Height: 5\' 5"  (165.1 cm) Weight: 216 lb 14.9 oz (98.4 kg) IBW/kg (Calculated) : 57  Vital Signs: Temp: 98.8 F (37.1 C) (01/21 0742) Temp src: Oral (01/21 0742) BP: 154/47 mmHg (01/21 0742) Intake/Output from previous day: 01/20 0701 - 01/21 0700 In: 2160 [P.O.:860; I.V.:1000; IV Piggyback:300] Out: 900 [Urine:900] Intake/Output from this shift:    Labs:  Recent Labs  01/21/13 1020 01/21/13 1421 01/22/13 0145 01/23/13 0305  WBC 14.5*  --  12.2* 11.8*  HGB 7.4* 7.4* 9.2* 9.0*  PLT 93*  --  95* 138*  CREATININE 1.34*  --  1.02 0.77   Estimated Creatinine Clearance: 66.3 ml/min (by C-G formula based on Cr of 0.77). No results found for this basename: VANCOTROUGH, VANCOPEAK, VANCORANDOM, GENTTROUGH, GENTPEAK, GENTRANDOM, TOBRATROUGH, TOBRAPEAK, TOBRARND, AMIKACINPEAK, AMIKACINTROU, AMIKACIN,  in the last 72 hours    Scheduled:  . atenolol  25 mg Oral Daily  . diltiazem  180 mg Oral Daily  . pantoprazole (PROTONIX) IV  40 mg Intravenous Q12H  . potassium chloride  40 mEq Oral BID  . sodium chloride  3 mL Intravenous Q12H  . vancomycin  750 mg Intravenous Q12H    Assessment: [name] with possible acute bacteremia and and pharmacy has been consulted to dose vancomycin and zosyn for empiric coverage. WBC= 14.5, SCr= 0.77 and CrCl ~ 66. Zosyn has been ordred per MD at 3.375gm IV q8h.  1/19 vanc>> 1/19 zosyn>>  1/19 urine 1/19 blood x2- gpc/clusters 1/2 1/20 blood x2- ngtd    Goal of Therapy:  Vancomycin trough level 15-20 mcg/ml  Plan:  -No zosyn changes needed -Change vancomycin to 1000mg  IV q12h; consider d/c vancomycin in am if repeat cultures remain NGTD? -Will follow renal function, cultures and clinical progress  Whitney [name], Pharm D 01/23/2013 8:25 AM

## 2013-01-24 LAB — BASIC METABOLIC PANEL
BUN: 11 mg/dL (ref 6–23)
CALCIUM: 8.3 mg/dL — AB (ref 8.4–10.5)
CHLORIDE: 108 meq/L (ref 96–112)
CO2: 22 meq/L (ref 19–32)
CREATININE: 0.73 mg/dL (ref 0.50–1.10)
GFR calc Af Amer: >90 mL/min (ref >90)
GFR calc non Af Amer: 79 mL/min — ABNORMAL LOW (ref >90)
Glucose, Bld: 98 mg/dL (ref 70–99)
Potassium: 4.6 mEq/L (ref 3.7–5.3)
Sodium: 142 mEq/L (ref 137–147)

## 2013-01-24 LAB — CBC
HCT: 27 % — ABNORMAL LOW (ref 36.0–46.0)
HEMOGLOBIN: 8.7 g/dL — AB (ref 12.0–15.0)
MCH: 30.3 pg (ref 26.0–34.0)
MCHC: 32.2 g/dL (ref 30.0–36.0)
MCV: 94.1 fL (ref 78.0–100.0)
Platelets: 158 10*3/uL (ref 150–400)
RBC: 2.87 MIL/uL — AB (ref 3.87–5.11)
RDW: 16.7 % — ABNORMAL HIGH (ref 11.5–15.5)
WBC: 10.7 10*3/uL — ABNORMAL HIGH (ref 4.0–10.5)

## 2013-01-24 MED ORDER — BOOST / RESOURCE BREEZE PO LIQD
1.0000 | Freq: Three times a day (TID) | ORAL | Status: DC
  Administered 2013-01-24 – 2013-01-26 (×5): 1 via ORAL

## 2013-01-24 MED ORDER — BOOST PLUS PO LIQD
237.0000 mL | Freq: Three times a day (TID) | ORAL | Status: DC
  Administered 2013-01-24 – 2013-01-26 (×5): 237 mL via ORAL
  Filled 2013-01-24 (×11): qty 237

## 2013-01-24 MED ORDER — DOCUSATE SODIUM 100 MG PO CAPS
100.0000 mg | ORAL_CAPSULE | Freq: Two times a day (BID) | ORAL | Status: DC
  Administered 2013-01-25 – 2013-01-26 (×2): 100 mg via ORAL
  Filled 2013-01-24 (×6): qty 1

## 2013-01-24 NOTE — Progress Notes (Signed)
  Subjective: No new changes  Objective: Vital signs in last 24 hours: Temp:  [98.2 F (36.8 C)-99 F (37.2 C)] 98.4 F (36.9 C) (01/22 0734) Pulse Rate:  [98] 98 (01/22 1016) Resp:  [16-30] 20 (01/22 0734) BP: (148-164)/(47-80) 164/58 mmHg (01/22 0734) SpO2:  [95 %-97 %] 97 % (01/22 0734) Last BM Date: 01/23/13  Intake/Output from previous day: 01/21 0701 - 01/22 0700 In: 120 [P.O.:120] Out: -  Intake/Output this shift:    Abdomen soft with minimal RUQ tenderness  Lab Results:   Recent Labs  01/23/13 0305 01/24/13 0311  WBC 11.8* 10.7*  HGB 9.0* 8.7*  HCT 27.4* 27.0*  PLT 138* 158   BMET  Recent Labs  01/23/13 0305 01/24/13 0311  NA 141 142  K 4.1 4.6  CL 106 108  CO2 23 22  GLUCOSE 101* 98  BUN 12 11  CREATININE 0.77 0.73  CALCIUM 8.0* 8.3*   PT/INR No results found for this basename: LABPROT, INR,  in the last 72 hours ABG No results found for this basename: PHART, PCO2, PO2, HCO3,  in the last 72 hours  Studies/Results: No results found.  Anti-infectives: Anti-infectives   Start     Dose/Rate Route Frequency Ordered Stop   01/22/13 1500  vancomycin (VANCOCIN) IVPB 750 mg/150 ml premix  Status:  Discontinued     750 mg 150 mL/hr over 60 Minutes Intravenous Every 12 hours 01/22/13 0915 01/23/13 0839   01/22/13 0300  vancomycin (VANCOCIN) IVPB 750 mg/150 ml premix  Status:  Discontinued     750 mg 150 mL/hr over 60 Minutes Intravenous Every 12 hours 01/21/13 1410 01/22/13 0841   01/21/13 1500  vancomycin (VANCOCIN) 1,500 mg in sodium chloride 0.9 % 500 mL IVPB     1,500 mg 250 mL/hr over 120 Minutes Intravenous  Once 01/21/13 1410 01/21/13 2120   01/21/13 1430  piperacillin-tazobactam (ZOSYN) IVPB 3.375 g  Status:  Discontinued     3.375 g 12.5 mL/hr over 240 Minutes Intravenous 3 times per day 01/21/13 1355 01/22/13 0841   01/21/13 1400  piperacillin-tazobactam (ZOSYN) IVPB 3.375 g  Status:  Discontinued     3.375 g 100 mL/hr over 30  Minutes Intravenous 3 times per day 01/21/13 1347 01/21/13 1352      Assessment/Plan: s/p * No surgery found *  Liver hematoma with possible mass  Will need outpt follow. Can be done with patient's primary care.  Will need an MRI of the liver in 4 to 6 weeks.  Follow up with surgery (Dr. Donell BeersByerly) is [name] mass is present in the liver.    Will see as needed during this hospitalization  LOS: 3 days    Whitney [name] 01/24/2013

## 2013-01-24 NOTE — Evaluation (Signed)
Physical Therapy Evaluation Patient Details Name: Whitney [name] MRN: [id number] DOB: [birth year] Today's Date: 01/24/2013 Time: 7829-5621 PT Time Calculation (min): 15 min  PT Assessment / Plan / Recommendation History of Present Illness  Whitney [name] is a 80 y.o. female with past med history of HTN, HLD who reported initially to [facility] for severe weakness. Prior to her hospitalization, she reports having 4-5 day history of N/V/D with fevers/chills where diarrhea resolved prior to admit but still having weakness. During this time period of being very ill, she reports eating very little food or drink, unable to keep down due to vomiting. She denies that the abdominal pain got worse when she ate her meals. EMS found to be hypotensive with sBP 70/40-90/48, mildy elevated LA, elevated WBC in 14K, and LA of 4.1. EKG showed ST depression in anterior and inferior leads. TTE showed hyperdynamic EF, and signs concerning for hypovolemica. SHe was fluid rescucitated and started on broad spectrum antibiotics due to concern that her GI symptoms were possibly due to cholecystitis with N/V and RUQ pain noted on exam vs. Pancreatitis since lipase mildly elevated. Her chemistry did reveal mild transaminitis with ALT 228 and AST 102 but bilirubin and ALP normal. abd u/s only showed evidence of cholelithiasis but no obstruction. Imaging did reveal a complex hepatic structure measured 14.3 x 7.6 x 14 on abd CT that was a  subcapsular hematoma along the    Clinical Impression  Whitney [name] is mobilizing well but visibly fatigued with hall ambulation. Pt was previously independent and doing yard work. Will follow acutely to maximize mobility, stamina and strength as pt with functional strength but reports feeling much weaker due to hospitalization to return pt to PLOF. Pt performed HEP and encouraged to continue daily as well as walking to the bathroom and taking walks with nursing in addition to therapy. Both pt and  dgtr agree she will have family support at home to increase activity.    PT Assessment  Patient needs continued PT services    Follow Up Recommendations  No PT follow up    Does the patient have the potential to tolerate intense rehabilitation      Barriers to Discharge        Equipment Recommendations  None recommended by PT    Recommendations for Other Services     Frequency Min 3X/week    Precautions / Restrictions Precautions Precautions: None   Pertinent Vitals/Pain HR 98 Soreness right flank, unrated      Mobility  Transfers Overall transfer level: Modified independent Transfers: Sit to/from Stand Sit to Stand: Modified independent (Device/Increase time) General transfer comment: increased time and use of armrests to transfer Ambulation/Gait Ambulation/Gait assistance: Modified independent (Device/Increase time) Ambulation Distance (Feet): 50 Feet Assistive device: None Gait Pattern/deviations: Step-through pattern;Decreased stride length Gait velocity interpretation: Below normal speed for age/gender General Gait Details: pt with steady gait and ambulating in hall with OT when PT initiated. Pt fatigued and limited ambulation with PT and used recliner to reach stairs Stairs: Yes Stairs assistance: Modified independent (Device/Increase time) Stair Management: One rail Right;Forwards;Alternating pattern Number of Stairs: 5    Exercises General Exercises - Lower Extremity Long Arc Quad: AROM;Seated;Both;15 reps Hip Flexion/Marching: AROM;Seated;Both;15 reps Toe Raises: AROM;Seated;Both;15 reps Heel Raises: AROM;Seated;Both;15 reps   PT Diagnosis: Difficulty walking  PT Problem List: Decreased strength;Decreased activity tolerance PT Treatment Interventions: Gait training;Functional mobility training;Therapeutic activities;Therapeutic exercise;Patient/family education     PT Goals(Current goals can be found in the care plan section)  Acute Rehab PT  Goals Patient Stated Goal: To go home PT Goal Formulation: With patient/family Time For Goal Achievement: 01/31/13 Potential to Achieve Goals: Good  Visit Information  Last PT Received On: 01/24/13 Assistance Needed: +1 History of Present Illness: Whitney [name] is a 80 y.o. female with past med history of HTN, HLD who reported initially to [facility] for severe weakness. Prior to her hospitalization, she reports having 4-5 day history of N/V/D with fevers/chills where diarrhea resolved prior to admit but still having weakness. During this time period of being very ill, she reports eating very little food or drink, unable to keep down due to vomiting. She denies that the abdominal pain got worse when she ate her meals. EMS found to be hypotensive with sBP 70/40-90/48, mildy elevated LA, elevated WBC in 14K, and LA of 4.1. EKG showed ST depression in anterior and inferior leads. TTE showed hyperdynamic EF, and signs concerning for hypovolemica. SHe was fluid rescucitated and started on broad spectrum antibiotics due to concern that her GI symptoms were possibly due to cholecystitis with N/V and RUQ pain noted on exam vs. Pancreatitis since lipase mildly elevated. Her chemistry did reveal mild transaminitis with ALT 228 and AST 102 but bilirubin and ALP normal. abd u/s only showed evidence of cholelithiasis but no obstruction. Imaging did reveal a complex hepatic structure measured 14.3 x 7.6 x 14 on abd CT that was a  subcapsular hematoma along the         Prior Functioning  Home Living Family/patient expects to be discharged to:: Private residence Living Arrangements: Children Available Help at Discharge: Available 24 hours/day Type of Home: Apartment Home Access: Stairs to enter Entergy CorporationEntrance Stairs-Number of Steps: 4-6 Entrance Stairs-Rails: Right Home Layout: One level Home Equipment: None Prior Function Level of Independence: Independent Comments: Pt denies falls.  She was independent with  all BADLs, IADLs including driving and community activities Communication Communication: No difficulties Dominant Hand: Right    Cognition  Cognition Arousal/Alertness: Awake/alert Behavior During Therapy: WFL for tasks assessed/performed Overall Cognitive Status: Within Functional Limits for tasks assessed    Extremity/Trunk Assessment Upper Extremity Assessment Upper Extremity Assessment: Defer to OT evaluation Lower Extremity Assessment Lower Extremity Assessment: Overall WFL for tasks assessed Cervical / Trunk Assessment Cervical / Trunk Assessment: Normal   Balance General Comments General comments (skin integrity, edema, etc.): Family plans to install grab bars around toilet and in bathtub.  Discussed options for grab bars and options for tub equipment  End of Session PT - End of Session Activity Tolerance: Patient limited by fatigue Patient left: in chair;with call bell/phone within reach;with family/visitor present Nurse Communication: Mobility status  GP     Delorse Lekabor, Lysette Lindenbaum Beth 01/24/2013, 10:20 AM Delaney MeigsMaija Tabor Hearl Heikes, PT [phone]

## 2013-01-24 NOTE — Evaluation (Signed)
Occupational Therapy Evaluation Patient Details Name: Whitney [name] MRN: [id number] DOB: 03-23-1931 Today's Date: 01/24/2013 Time: 0912-0932 OT Time Calculation (min): 20 min  OT Assessment / Plan / Recommendation History of present illness Whitney [name] is a 80 y.o. female with past med history of HTN, HLD who reported initially to [facility] for severe weakness. Prior to her hospitalization, she reports having 4-5 day history of N/V/D with fevers/chills where diarrhea resolved prior to admit but still having weakness. During this time period of being very ill, she reports eating very little food or drink, unable to keep down due to vomiting. She denies that the abdominal pain got worse when she ate her meals. EMS found to be hypotensive with sBP 70/40-90/48, mildy elevated LA, elevated WBC in 14K, and LA of 4.1. EKG showed ST depression in anterior and inferior leads. TTE showed hyperdynamic EF, and signs concerning for hypovolemica. SHe was fluid rescucitated and started on broad spectrum antibiotics due to concern that her GI symptoms were possibly due to cholecystitis with N/V and RUQ pain noted on exam vs. Pancreatitis since lipase mildly elevated. Her chemistry did reveal mild transaminitis with ALT 228 and AST 102 but bilirubin and ALP normal. abd u/s only showed evidence of cholelithiasis but no obstruction. Imaging did reveal a complex hepatic structure measured 14.3 x 7.6 x 14 on abd CT that was a  subcapsular hematoma along the     Clinical Impression   Patient evaluated by Occupational Therapy with no further acute OT needs identified. All education has been completed and the patient has no further questions. Currently, pt requires supervision to modified independent level with BADLs.  She fatigues quickly and appears to be uncomfortable.  She and dtr report that she will have 24 hour assist at discharge.  Pt encouraged to perform self care activities with supervision only and to  ambulate with family and nursing.  Discussed options for tub DME with dtr.  Family will acquire a seat and plan to install grab bars in bathroom.  See below for any follow-up Occupational Therapy or equipment needs. OT is signing off. Thank you for this referral.     OT Assessment  Patient does not need any further OT services    Follow Up Recommendations  No OT follow up;Supervision/Assistance - 24 hour    Barriers to Discharge      Equipment Recommendations  Other (comment) (family will acquire tub equipment)    Recommendations for Other Services    Frequency       Precautions / Restrictions     Pertinent Vitals/Pain     ADL  Eating/Feeding: Independent (poor intake) Where Assessed - Eating/Feeding: Chair Grooming: Wash/dry hands;Wash/dry face;Teeth care;Supervision/safety Where Assessed - Grooming: Unsupported standing Upper Body Bathing: Set up Where Assessed - Upper Body Bathing: Unsupported sitting Lower Body Bathing: Supervision/safety Where Assessed - Lower Body Bathing: Supported sit to stand Upper Body Dressing: Set up Where Assessed - Upper Body Dressing: Unsupported sitting Lower Body Dressing: Supervision/safety Where Assessed - Lower Body Dressing: Unsupported sit to stand Toilet Transfer: Supervision/safety Toilet Transfer Method: Sit to stand;Stand pivot AcupuncturistToilet Transfer Equipment: Regular height toilet;Comfort height toilet Toileting - Clothing Manipulation and Hygiene: Supervision/safety Where Assessed - Engineer, miningToileting Clothing Manipulation and Hygiene: Standing Tub/Shower Transfer: Supervision/safety Tub/Shower Transfer Method: Stand pivot;Squat pivot Transfers/Ambulation Related to ADLs: Pt requires supervision due to fatigue ADL Comments: Status varies between modified independent and supervision.  She fatigues with quickly     OT Diagnosis:  OT Problem List:   OT Treatment Interventions:     OT Goals(Current goals can be found in the care plan  section) Acute Rehab OT Goals Patient Stated Goal: To go home  Visit Information  Last OT Received On: 01/24/13 Assistance Needed: +1 History of Present Illness: Whitney [name] is a 80 y.o. female with past med history of HTN, HLD who reported initially to [facility] for severe weakness. Prior to her hospitalization, she reports having 4-5 day history of N/V/D with fevers/chills where diarrhea resolved prior to admit but still having weakness. During this time period of being very ill, she reports eating very little food or drink, unable to keep down due to vomiting. She denies that the abdominal pain got worse when she ate her meals. EMS found to be hypotensive with sBP 70/40-90/48, mildy elevated LA, elevated WBC in 14K, and LA of 4.1. EKG showed ST depression in anterior and inferior leads. TTE showed hyperdynamic EF, and signs concerning for hypovolemica. SHe was fluid rescucitated and started on broad spectrum antibiotics due to concern that her GI symptoms were possibly due to cholecystitis with N/V and RUQ pain noted on exam vs. Pancreatitis since lipase mildly elevated. Her chemistry did reveal mild transaminitis with ALT 228 and AST 102 but bilirubin and ALP normal. abd u/s only showed evidence of cholelithiasis but no obstruction. Imaging did reveal a complex hepatic structure measured 14.3 x 7.6 x 14 on abd CT that was a  subcapsular hematoma along the         Prior Functioning     Home Living Family/patient expects to be discharged to:: Private residence Living Arrangements: Children Available Help at Discharge: Available 24 hours/day Type of Home: Apartment Home Access: Stairs to enter [employer] of Steps: 4-6 Entrance Stairs-Rails: Right Home Layout: One level Home Equipment: None Prior Function Level of Independence: Independent Comments: Pt denies falls.  She was independent with all BADLs, IADLs including driving and community  activities Communication Communication: No difficulties Dominant Hand: Right         Vision/Perception     Cognition  Cognition Arousal/Alertness: Awake/alert Behavior During Therapy: WFL for tasks assessed/performed Overall Cognitive Status: Within Functional Limits for tasks assessed    Extremity/Trunk Assessment Upper Extremity Assessment Upper Extremity Assessment: Overall WFL for tasks assessed Lower Extremity Assessment Lower Extremity Assessment: Defer to PT evaluation Cervical / Trunk Assessment Cervical / Trunk Assessment: Normal     Mobility Transfers Overall transfer level: Needs assistance Transfers: Sit to/from Stand Sit to Stand: Modified independent (Device/Increase time) General transfer comment: supervision for ambulation due to fatigue     Exercise     Balance General Comments General comments (skin integrity, edema, etc.): Family plans to install grab bars around toilet and in bathtub.  Discussed options for grab bars and options for tub equipment   End of Session OT - End of Session Activity Tolerance: Patient limited by fatigue Patient left: in chair;with call bell/phone within reach;with family/visitor present Nurse Communication: Mobility status;Patient requests pain meds  GO     Sharelle Burditt, Ursula Alert M 01/24/2013, 10:04 AM

## 2013-01-24 NOTE — Progress Notes (Signed)
Pt admitted to [facility] from [facility], [place].  Pt is alert and oriented to staff, call bell, and room. Bed in lowest position. Call bell within reach. Full assessment to Epic. Will continue to monitor.

## 2013-01-24 NOTE — Progress Notes (Signed)
Team 1/Stepdown ICU Progress Note    Whitney [name] ZOX:[id number] DOB: 1931-04-28 DOA: 01/21/2013 PCP: Milana Obey, MD  Brief narrative: 79 year old female patient with history of hypertension and dyslipidemia. Presented to [facility] with complaints of nausea, vomiting, diarrhea and profound weakness over 5 days. She felt profoundly weak and called EMS for transport. Upon arrival she was found to have a blood pressure 70/44. Her EKG showed ST depressions in the anterior and inferior leads. Cardiology was consulted and felt the patient would benefit from transfer to [facility]. Upon arrival to [facility] she was evaluated by another Cardiologist who performed a bedside echo that demonstrated no wall motion abnormalities and preserved LV function. Cardiology did not think the patient's symptoms were caused by cardiac etiology.  Assessment/Plan:  Subcapsular hemorrhage of liver -CT revealed hematoma with concerns of possible underlying mass; unfortunately current bleeding obscures clear delineation -will need followup imaging in several weeks after discharge -Symptom management re: pain -Follow labs  Hypotension -Multifactorial due to hypovolemia and some blood loss anemia -Has resolved so will resume home medications-see below -Decreased IV fluids to keep open 1/21    Lactic acidosis -Due to dehydration in recent hypotension  Nausea with vomiting -Unclear if patient had viral gastroenteritis prior to presentation or if had spontaneous hemorrhage that led to nausea and vomiting -resolved but still has anorexia- possibly from the pain or side effects from pain meds-add diet supplements  Hypokalemia -Oral replete  Anemia, unspecified -Hemoglobin up to 9 after transfusion 2 units of packed red blood cells  Acute renal failure -Secondary to hypotension, acute blood loss anemia, and dehydration -resolved  Positive blood culture/leukocytosis -ED  blood cx + for coag neg staph in clusters unfortunately only one bottle obtain so unable to clarify if skin contaminant or true result -Had already been started on antibiotics -repeat blood cultures x2 sets NGTD -ID rec dc Vanco and rpt cx's 48 hrs later- due Saturday 1/24  Essential hypertension -Cont Tenormin and diltiazem -Resume ACE inhibitor but continue to hold Lasix for now esp. with poor intake -somewhat uncontrolled and suspect pain influencing  Hyperlipidemia  abnormal electrocardiogram  -Likely mild demand ischemic changes related to hypotension and anemia -Cardiology evaluated and no acute issues - have signed off  Thrombocytopenia, unspecified -Likely due to consumption -Follow  DVT prophylaxis: SCDs Code Status: Full Family Communication: Dtr at bedside Disposition Plan/Expected LOS: Transfer to floor   Consultants: Cardiology  Procedures: None  Antibiotics: Vancomycin 1/19 >>> Zosyn 1/19 >>>1/20  HPI/Subjective: Endorses very "sore" RUQ-per PT pt grimacing quite a bit with activity- seems to be minimizing sx's  Objective: Blood pressure 164/58, pulse 98, temperature 98.4 F (36.9 C), temperature source Oral, resp. rate 20, height 5\' 5"  (1.651 m), weight 216 lb 14.9 oz (98.4 kg), SpO2 97.00%.  Intake/Output Summary (Last 24 hours) at 01/24/13 1404 Last data filed at 01/23/13 2100  Gross per 24 hour  Intake    120 ml  Output      0 ml  Net    120 ml   Exam: General: No acute respiratory distress Lungs: Clear to auscultation bilaterally without wheezes or crackles, RA Cardiovascular: Regular rate and rhythm without murmur gallop or rub normal S1 and S2, no peripheral edema  Abdomen: Tender right upper quadrant with no guarding- no rebounding, nondistended, soft, bowel sounds positive, no ascites, no appreciable mass Musculoskeletal: No significant cyanosis, clubbing of bilateral lower extremities Neurological: Alert and oriented x 3, moves all  extremities x 4 without focal neurological deficits  Scheduled Meds:  Scheduled Meds: . atenolol  25 mg Oral Daily  . diltiazem  180 mg Oral Daily  . docusate sodium  100 mg Oral BID  . feeding supplement (RESOURCE BREEZE)  1 Container Oral TID BM  . ferrous sulfate  325 mg Oral Q breakfast  . lactose free nutrition  237 mL Oral TID WC  . lisinopril  20 mg Oral Daily  . pantoprazole  40 mg Oral BID  . potassium chloride SA  20 mEq Oral Daily  . sodium chloride  3 mL Intravenous Q12H   Data Reviewed: Basic Metabolic Panel:  Recent Labs Lab 01/21/13 1020 01/22/13 0145 01/23/13 0305 01/24/13 0311  NA 143 143 141 142  K 3.0* 3.6* 4.1 4.6  CL 101 105 106 108  CO2 26 26 23 22   GLUCOSE 160* 108* 101* 98  BUN 27* 23 12 11   CREATININE 1.34* 1.02 0.77 0.73  CALCIUM 8.0* 7.5* 8.0* 8.3*   Liver Function Tests:  Recent Labs Lab 01/21/13 1020 01/22/13 0145 01/23/13 0305  AST 102* 74* 42*  ALT 228* 176* 124*  ALKPHOS 62 64 69  BILITOT 1.1 1.1 1.0  PROT 5.7* 5.3* 5.5*  ALBUMIN 3.1* 2.8* 2.7*   CBC:  Recent Labs Lab 01/21/13 1020 01/21/13 1421 01/22/13 0145 01/23/13 0305 01/24/13 0311  WBC 14.5*  --  12.2* 11.8* 10.7*  NEUTROABS 9.9*  --   --   --   --   HGB 7.4* 7.4* 9.2* 9.0* 8.7*  HCT 23.6* 21.9* 26.8* 27.4* 27.0*  MCV 97.5  --  89.9 92.9 94.1  PLT 93*  --  95* 138* 158   Cardiac Enzymes:  Recent Labs Lab 01/21/13 1020 01/21/13 1421 01/22/13 0145  TROPONINI <0.30 <0.30 <0.30    Recent Results (from the past 240 hour(s))  CULTURE, BLOOD (ROUTINE X 2)     Status: None   Collection Time    01/21/13 10:20 AM      Result Value Range Status   Specimen Description BLOOD LEFT IJ COLLECTED BY DOCTOR WARD   Final   Special Requests BOTTLES DRAWN AEROBIC AND ANAEROBIC 8CC   Final   Culture  Setup Time     Final   Value: 01/22/2013 02:55     Performed at [facility]   Culture     Final   Value: STAPHYLOCOCCUS SPECIES (COAGULASE NEGATIVE)     Note:  THE SIGNIFICANCE OF ISOLATING THIS ORGANISM FROM A SINGLE VENIPUNCTURE CANNOT BE PREDICTED WITHOUT FURTHER CLINICAL AND CULTURE CORRELATION. SUSCEPTIBILITIES AVAILABLE ONLY ON REQUEST.     Note: Gram Stain Report Called to,Read Back By and Verified With: Franchot Gallo AT [facility] [id number] 01/22/13 BY Berton Lan K     Performed at [facility]   Report Status 01/23/2013 FINAL   Final  MRSA PCR SCREENING     Status: None   Collection Time    01/21/13 11:33 AM      Result Value Range Status   MRSA by PCR NEGATIVE  NEGATIVE Final   Comment:            The GeneXpert MRSA Assay (FDA     approved for NASAL specimens     only), is one component of a     comprehensive MRSA colonization     surveillance program. It is not     intended to diagnose MRSA     infection nor to guide or  monitor treatment for     MRSA infections.  URINE CULTURE     Status: None   Collection Time    01/21/13 10:37 PM      Result Value Range Status   Specimen Description URINE, CLEAN CATCH   Final   Special Requests NONE   Final   Culture  Setup Time     Final   Value: 01/21/2013 23:47     [facility]formed at [facility]   Colony Count     Final   Value: NO GROWTH     [facility]   Culture     Final   Value: NO GROWTH     [facility] at [facility]   Report Status 01/23/2013 FINAL   Final  CULTURE, BLOOD (ROUTINE X 2)     Status: None   Collection Time    01/22/13  9:10 AM      Result Value Range Status   Specimen Description BLOOD RIGHT ARM   Final   Special Requests BOTTLES DRAWN AEROBIC AND ANAEROBIC 10CC   Final   Culture  Setup Time     Final   Value: 01/22/2013 13:30     [facility]formed at [facility]   Culture     Final   Value:        BLOOD CULTURE RECEIVED NO GROWTH TO DATE CULTURE WILL BE HELD FOR 5 DAYS BEFORE ISSUING A FINAL NEGATIVE REPORT     [facility]formed at [facility]   Report Status PENDING   Incomplete  CULTURE, BLOOD (ROUTINE X 2)     Status: None    Collection Time    01/22/13  9:25 AM      Result Value Range Status   Specimen Description BLOOD RIGHT HAND   Final   Special Requests BOTTLES DRAWN AEROBIC AND ANAEROBIC 10CC   Final   Culture  Setup Time     Final   Value: 01/22/2013 13:32     Performed at [facility]   Culture     Final   Value:        BLOOD CULTURE RECEIVED NO GROWTH TO DATE CULTURE WILL BE HELD FOR 5 DAYS BEFORE ISSUING A FINAL NEGATIVE REPORT     Performed at [facility]   Report Status PENDING   Incomplete     Studies:  Recent x-ray studies have been reviewed in detail by the Attending Physician  Time spent : 35 minutes     Junious Silkllison Ellis, ANP [facility] Office  [phone] Pager [phone]  **If unable to reach the above provider after paging please contact the Flow Manager @ [phone]  On-Call/Text Page:      [email or web address] [email or web address]      password [email or web address]  If 7PM-7AM, please contact night-coverage [email or web address] Password [email or web address] 01/24/2013, 2:04 PM   LOS: 3 days    I have examined the patient, reviewed the chart and modified the above note which I agree with.   Dashanique Brownstein,MD [phone] 01/24/2013, 6:27 PM

## 2013-01-25 DIAGNOSIS — R7881 Bacteremia: Secondary | ICD-10-CM

## 2013-01-25 LAB — CBC
HEMATOCRIT: 30.2 % — AB (ref 36.0–46.0)
Hemoglobin: 9.7 g/dL — ABNORMAL LOW (ref 12.0–15.0)
MCH: 29.8 pg (ref 26.0–34.0)
MCHC: 32.1 g/dL (ref 30.0–36.0)
MCV: 92.9 fL (ref 78.0–100.0)
Platelets: 226 10*3/uL (ref 150–400)
RBC: 3.25 MIL/uL — AB (ref 3.87–5.11)
RDW: 16.4 % — AB (ref 11.5–15.5)
WBC: 12.2 10*3/uL — ABNORMAL HIGH (ref 4.0–10.5)

## 2013-01-25 MED ORDER — SIMETHICONE 80 MG PO CHEW
80.0000 mg | CHEWABLE_TABLET | Freq: Four times a day (QID) | ORAL | Status: DC | PRN
  Filled 2013-01-25 (×2): qty 1

## 2013-01-25 NOTE — Progress Notes (Signed)
[facility]    Date of Admission:  01/21/2013   Total days of antibiotics 2        Off of antibiotics   ID: Whitney ShiStella A [name] is a 80 y.o. female who presented with dehydration thought to be due to viral gastroenteritis vs. Cholecystitis found to have large hepatic hematoma and possible liver mass. Active Problems:   Essential hypertension   Hyperlipidemia   Hypotension   Transaminitis   Nausea with vomiting   Abdominal pain, right upper quadrant   abnormal electrocardiogram (EKG)   Anemia, unspecified   Lactic acidosis   Thrombocytopenia, unspecified   Subcapsular hemorrhage of liver   Acute renal failure   Positive blood culture    Subjective: Had isolated temp of 100.6 last night but remains afebrile thereafter. She states that she is feeling much better. Less abdominal pain, but "gas pain" last night.  Medications:  . atenolol  25 mg Oral Daily  . diltiazem  180 mg Oral Daily  . docusate sodium  100 mg Oral BID  . feeding supplement (RESOURCE BREEZE)  1 Container Oral TID BM  . ferrous sulfate  325 mg Oral Q breakfast  . lactose free nutrition  237 mL Oral TID WC  . lisinopril  20 mg Oral Daily  . pantoprazole  40 mg Oral BID  . potassium chloride SA  20 mEq Oral Daily  . sodium chloride  3 mL Intravenous Q12H    Objective: Vital signs in last 24 hours: Temp:  [98.4 F (36.9 C)-100.6 F (38.1 C)] 98.6 F (37 C) (01/23 0832) Pulse Rate:  [87-99] 99 (01/23 0832) Resp:  [20-36] 24 (01/23 0832) BP: (149-179)/(46-80) 152/80 mmHg (01/23 0832) SpO2:  [95 %-100 %] 99 % (01/23 0832) Weight:  [227 lb (102.967 kg)] 227 lb (102.967 kg) (01/22 2047)  Constitutional: oriented to person, place, and time. appears fatigued. No distress.  HENT:  Mouth/Throat: Oropharynx is clear and moist. No oropharyngeal exudate.  Cardiovascular: Normal rate, regular rhythm and normal heart sounds. Exam reveals no gallop and no friction rub.  No murmur  heard.  Pulmonary/Chest: Effort normal and breath sounds normal. No respiratory distress. has no wheezes.  Abdominal: Soft. Bowel sounds are normal. exhibits no distension.  Lymphadenopathy: no cervical adenopathy.  Neurological: alert and oriented to person, place, and time.  Skin: Skin is warm and dry. No rash noted. No erythema.  Psychiatric: a normal mood and affect. behavior is normal.   Lab Results  Recent Labs  01/23/13 0305 01/24/13 0311 01/25/13 0555  WBC 11.8* 10.7* 12.2*  HGB 9.0* 8.7* 9.7*  HCT 27.4* 27.0* 30.2*  NA 141 142  --   K 4.1 4.6  --   CL 106 108  --   CO2 23 22  --   BUN 12 11  --   CREATININE 0.77 0.73  --    Liver Panel  Recent Labs  01/23/13 0305  PROT 5.5*  ALBUMIN 2.7*  AST 42*  ALT 124*  ALKPHOS 69  BILITOT 1.0   Sedimentation Rate No results found for this basename: ESRSEDRATE,  in the last 72 hours C-Reactive Protein No results found for this basename: CRP,  in the last 72 hours  Microbiology: 1/19 blood CoNS 1/20 blood cx NGTD  Studies/Results: No results found.   Assessment/Plan: CoNS isolated in blood culture = difficult to tell if it is a true pathogen since only 1 set collected at that time. Patient has been off  of antibiotics, recommend to repeat blood cultures and ua plus urine cx if she spikes fever today or tomorrow.  Will follow up on surveillance culture to decide if need to treat or due further work up.  Dr. Orvan Falconer available for questions. i will follow up on Monday  Shannette Tabares B. Drue Second MD MPH [facility] [phone]   Drue Second [facility] Cell: [phone] Pager: [phone]  01/25/2013, 11:45 AM

## 2013-01-25 NOTE — Progress Notes (Signed)
Team 1/Stepdown ICU Progress Note    Whitney [name] GNF:[id number] DOB: Jan 18, 1931 DOA: 01/21/2013 PCP: Milana Obey, MD  Brief narrative: 80 year old female patient with history of HTN and HLD presented to [facility] with complaints of nausea, vomiting, diarrhea and profound weakness over 5 days. She felt profoundly weak and called EMS for transport. Upon arrival she was found to have a blood pressure 70/44. Her EKG showed ST depressions in the anterior and inferior leads. Cardiology was consulted and felt the patient would benefit from transfer to [facility]. Upon arrival to [facility] she was evaluated by another Cardiologist who performed a bedside echo that demonstrated no wall motion abnormalities and preserved LV function. Cardiology did not think the patient's symptoms were caused by cardiac etiology.  Assessment/Plan:  Subcapsular hemorrhage of liver -CT revealed hematoma with concerns of possible underlying mass; unfortunately current bleeding obscures clear delineation -will need followup imaging in several weeks after discharge -Symptom management re: pain -Follow labs - stable  Hypotension -Multifactorial due to hypovolemia and some blood loss anemia -Has resolved so will resume home medications-see below -Decreased IV fluids to keep open 1/21 - stable    Lactic acidosis -Due to dehydration in recent hypotension  Nausea with vomiting -Unclear if patient had viral gastroenteritis prior to presentation or if had spontaneous hemorrhage that led to nausea and vomiting -resolved but still has anorexia- possibly from the pain or side effects from pain meds-add diet supplements - Patient was made n.p.o. overnight for unclear reasons. No procedures planned. We'll resume that she has been tolerating until last night.  Hypokalemia -Oral repleted  Anemia, unspecified -Hemoglobin up to 9 after transfusion 2 units of packed red blood cells -  Stable   Acute renal failure -Secondary to hypotension, acute blood loss anemia, and dehydration -resolved  Positive blood culture/leukocytosis -ED blood cx + for coag neg staph in clusters unfortunately only one bottle obtain so unable to clarify if skin contaminant or true result -Had already been started on antibiotics -repeat blood cultures x2 sets NGTD -ID rec dc Vanco and rpt cx's 48 hrs later- due Saturday 1/24 - ? DC after drawing Blood Cx on 1/24  Essential hypertension -Cont Tenormin and diltiazem -Resume ACE inhibitor but continue to hold Lasix for now esp. with poor intake -somewhat uncontrolled and suspect pain influencing  Hyperlipidemia  abnormal electrocardiogram  -Likely mild demand ischemic changes related to hypotension and anemia -Cardiology evaluated and no acute issues - have signed off  Thrombocytopenia, unspecified -Likely due to consumption -Resolved  DVT prophylaxis: SCDs Code Status: Full Family Communication: Dtr's at bedside Disposition Plan/Expected LOS: DC home possibly 1/24   Consultants: Cardiology Surgery  Procedures: None  Antibiotics: Vancomycin 1/19 >>> DC'd Zosyn 1/19 >>>1/20  HPI/Subjective: Hungry. Mild RUQ pain. No nausea, vomiting or diarrhea. Normal stools yesterday. Anxious to go home.  Objective: Blood pressure 151/79, pulse 88, temperature 99.2 F (37.3 C), temperature source Axillary, resp. rate 22, height 5\' 5"  (1.651 m), weight 102.967 kg (227 lb), SpO2 96.00%.  Intake/Output Summary (Last 24 hours) at 01/25/13 1244 Last data filed at 01/24/13 1900  Gross per 24 hour  Intake      0 ml  Output      0 ml  Net      0 ml   Exam: General: No acute respiratory distress Lungs: Clear to auscultation bilaterally without wheezes or crackles, RA Cardiovascular: Regular rate and rhythm without murmur gallop or rub normal S1 and S2,  no peripheral edema  Abdomen: Mildly tender RUQ without acute peritoneal signs. Normal  bowel sounds heard. Musculoskeletal: No significant cyanosis, clubbing of bilateral lower extremities Neurological: Alert and oriented x 3, moves all extremities x 4 without focal neurological deficits  Scheduled Meds:  Scheduled Meds: . atenolol  25 mg Oral Daily  . diltiazem  180 mg Oral Daily  . docusate sodium  100 mg Oral BID  . feeding supplement (RESOURCE BREEZE)  1 Container Oral TID BM  . ferrous sulfate  325 mg Oral Q breakfast  . lactose free nutrition  237 mL Oral TID WC  . lisinopril  20 mg Oral Daily  . pantoprazole  40 mg Oral BID  . potassium chloride SA  20 mEq Oral Daily  . sodium chloride  3 mL Intravenous Q12H   Data Reviewed: Basic Metabolic Panel:  Recent Labs Lab 01/21/13 1020 01/22/13 0145 01/23/13 0305 01/24/13 0311  NA 143 143 141 142  K 3.0* 3.6* 4.1 4.6  CL 101 105 106 108  CO2 26 26 23 22   GLUCOSE 160* 108* 101* 98  BUN 27* 23 12 11   CREATININE 1.34* 1.02 0.77 0.73  CALCIUM 8.0* 7.5* 8.0* 8.3*   Liver Function Tests:  Recent Labs Lab 01/21/13 1020 01/22/13 0145 01/23/13 0305  AST 102* 74* 42*  ALT 228* 176* 124*  ALKPHOS 62 64 69  BILITOT 1.1 1.1 1.0  PROT 5.7* 5.3* 5.5*  ALBUMIN 3.1* 2.8* 2.7*   CBC:  Recent Labs Lab 01/21/13 1020 01/21/13 1421 01/22/13 0145 01/23/13 0305 01/24/13 0311 01/25/13 0555  WBC 14.5*  --  12.2* 11.8* 10.7* 12.2*  NEUTROABS 9.9*  --   --   --   --   --   HGB 7.4* 7.4* 9.2* 9.0* 8.7* 9.7*  HCT 23.6* 21.9* 26.8* 27.4* 27.0* 30.2*  MCV 97.5  --  89.9 92.9 94.1 92.9  PLT 93*  --  95* 138* 158 226   Cardiac Enzymes:  Recent Labs Lab 01/21/13 1020 01/21/13 1421 01/22/13 0145  TROPONINI <0.30 <0.30 <0.30    Recent Results (from the past 240 hour(s))  CULTURE, BLOOD (ROUTINE X 2)     Status: None   Collection Time    01/21/13 10:20 AM      Result Value Range Status   Specimen Description BLOOD LEFT IJ COLLECTED BY DOCTOR WARD   Final   Special Requests BOTTLES DRAWN AEROBIC AND ANAEROBIC  8CC   Final   Culture  Setup Time     Final   Value: 01/22/2013 02:55     Performed at [facility]   Culture     Final   Value: STAPHYLOCOCCUS SPECIES (COAGULASE NEGATIVE)     Note: THE SIGNIFICANCE OF ISOLATING THIS ORGANISM FROM A SINGLE VENIPUNCTURE CANNOT BE PREDICTED WITHOUT FURTHER CLINICAL AND CULTURE CORRELATION. SUSCEPTIBILITIES AVAILABLE ONLY ON REQUEST.     Note: Gram Stain Report Called to,Read Back By and Verified With: Franchot Gallo AT [facility] 1610 01/22/13 BY Berton Lan K     Performed at [facility]   Report Status 01/23/2013 FINAL   Final  MRSA PCR SCREENING     Status: None   Collection Time    01/21/13 11:33 AM      Result Value Range Status   MRSA by PCR NEGATIVE  NEGATIVE Final   Comment:            The GeneXpert MRSA Assay (FDA     approved for NASAL specimens  only), is one component of a     comprehensive MRSA colonization     surveillance program. It is not     intended to diagnose MRSA     infection nor to guide or     monitor treatment for     MRSA infections.  URINE CULTURE     Status: None   Collection Time    01/21/13 10:37 PM      Result Value Range Status   Specimen Description URINE, CLEAN CATCH   Final   Special Requests NONE   Final   Culture  Setup Time     Final   Value: 01/21/2013 23:47     Performed at [facility]   Colony Count     Final   Value: NO GROWTH     [facility]formed at [facility]   Culture     Final   Value: NO GROWTH     Performed at [facility]   Report Status 01/23/2013 FINAL   Final  CULTURE, BLOOD (ROUTINE X 2)     Status: None   Collection Time    01/22/13  9:10 AM      Result Value Range Status   Specimen Description BLOOD RIGHT ARM   Final   Special Requests BOTTLES DRAWN AEROBIC AND ANAEROBIC 10CC   Final   Culture  Setup Time     Final   Value: 01/22/2013 13:30     Performed at [facility]   Culture     Final   Value:        BLOOD CULTURE RECEIVED NO GROWTH TO DATE  CULTURE WILL BE HELD FOR 5 DAYS BEFORE ISSUING A FINAL NEGATIVE REPORT     Performed at [facility]   Report Status PENDING   Incomplete  CULTURE, BLOOD (ROUTINE X 2)     Status: None   Collection Time    01/22/13  9:25 AM      Result Value Range Status   Specimen Description BLOOD RIGHT HAND   Final   Special Requests BOTTLES DRAWN AEROBIC AND ANAEROBIC 10CC   Final   Culture  Setup Time     Final   Value: 01/22/2013 13:32     Performed at [facility]   Culture     Final   Value:        BLOOD CULTURE RECEIVED NO GROWTH TO DATE CULTURE WILL BE HELD FOR 5 DAYS BEFORE ISSUING A FINAL NEGATIVE REPORT     Performed at [facility]   Report Status PENDING   Incomplete     Studies:  Recent x-ray studies have been reviewed in detail by the Attending Physician  Time spent : 35 minutes 01/25/2013, 12:44 PM   LOS: 4 days    HONGALGI,ANAND, MD, FACP, FHM. [facility] Pager [phone]  If 7PM-7AM, please contact night-coverage [email or web address] Password [email or web address] 01/25/2013, 12:53 PM

## 2013-01-25 NOTE — Progress Notes (Signed)
Physical Therapy Treatment Patient Details Name: Whitney [name] MRN: [id number] DOB: 1931-08-19 Today's Date: 01/25/2013 Time: 1914-7829 PT Time Calculation (min): 8 min  PT Assessment / Plan / Recommendation  History of Present Illness Whitney [name] is a 80 y.o. female with past med history of HTN, HLD who reported initially to [facility] for severe weakness. Prior to her hospitalization, she reports having 4-5 day history of N/V/D with fevers/chills where diarrhea resolved prior to admit but still having weakness. During this time period of being very ill, she reports eating very little food or drink, unable to keep down due to vomiting. She denies that the abdominal pain got worse when she ate her meals. EMS found to be hypotensive with sBP 70/40-90/48, mildy elevated LA, elevated WBC in 14K, and LA of 4.1. EKG showed ST depression in anterior and inferior leads. TTE showed hyperdynamic EF, and signs concerning for hypovolemica. SHe was fluid rescucitated and started on broad spectrum antibiotics due to concern that her GI symptoms were possibly due to cholecystitis with N/V and RUQ pain noted on exam vs. Pancreatitis since lipase mildly elevated. Her chemistry did reveal mild transaminitis with ALT 228 and AST 102 but bilirubin and ALP normal. abd u/s only showed evidence of cholelithiasis but no obstruction. Imaging did reveal a complex hepatic structure measured 14.3 x 7.6 x 14 on abd CT that was a  subcapsular hematoma along the     PT Comments   Pt doesn't seem quite as steady in hallway today.  Follow Up Recommendations  No PT follow up     Does the patient have the potential to tolerate intense rehabilitation     Barriers to Discharge        Equipment Recommendations  None recommended by PT    Recommendations for Other Services    Frequency Min 3X/week   Progress towards PT Goals Progress towards PT goals: Progressing toward goals  Plan Current plan remains appropriate     Precautions / Restrictions Precautions Precautions: None   Pertinent Vitals/Pain No c/o's    Mobility  Bed Mobility Overal bed mobility: Needs Assistance Bed Mobility: Supine to Sit;Sit to Supine Supine to sit: Min assist;HOB elevated Sit to supine: Min assist;HOB elevated General bed mobility comments: Assist to bring trunk up to sitting; Assist to bring feet up to return to supine. Transfers Sit to Stand: Modified independent (Device/Increase time) General transfer comment: Incr time Ambulation/Gait Ambulation/Gait assistance: Min assist Ambulation Distance (Feet): 110 Feet Assistive device: 1 person hand held assist;None (or wall rail) Gait Pattern/deviations: Step-through pattern;Decreased stride length General Gait Details: pt reaching for wall rail or my hand with gait.     Exercises     PT Diagnosis:    PT Problem List:   PT Treatment Interventions:     PT Goals (current goals can now be found in the care plan section)    Visit Information  Last PT Received On: 01/25/13 Assistance Needed: +1 History of Present Illness: Whitney [name] is a 80 y.o. female with past med history of HTN, HLD who reported initially to [facility] for severe weakness. Prior to her hospitalization, she reports having 4-5 day history of N/V/D with fevers/chills where diarrhea resolved prior to admit but still having weakness. During this time period of being very ill, she reports eating very little food or drink, unable to keep down due to vomiting. She denies that the abdominal pain got worse when she ate her meals. EMS found to be hypotensive  with sBP 70/40-90/48, mildy elevated LA, elevated WBC in 14K, and LA of 4.1. EKG showed ST depression in anterior and inferior leads. TTE showed hyperdynamic EF, and signs concerning for hypovolemica. SHe was fluid rescucitated and started on broad spectrum antibiotics due to concern that her GI symptoms were possibly due to cholecystitis with N/V and RUQ pain  noted on exam vs. Pancreatitis since lipase mildly elevated. Her chemistry did reveal mild transaminitis with ALT 228 and AST 102 but bilirubin and ALP normal. abd u/s only showed evidence of cholelithiasis but no obstruction. Imaging did reveal a complex hepatic structure measured 14.3 x 7.6 x 14 on abd CT that was a  subcapsular hematoma along the      Subjective Data      Cognition  Cognition Arousal/Alertness: Awake/alert Behavior During Therapy: WFL for tasks assessed/performed Overall Cognitive Status: Within Functional Limits for tasks assessed    Balance  Balance Overall balance assessment: Needs assistance Standing balance-Leahy Scale: Fair  End of Session PT - End of Session Activity Tolerance: Patient limited by fatigue Patient left: in bed;with call bell/phone within reach;with family/visitor present Nurse Communication: Mobility status   GP     [facility] CenterMAYCOCK,Kadisha Goodine 01/25/2013, 2:31 PM  [facility] [place] [facility] [phone]

## 2013-01-26 LAB — COMPREHENSIVE METABOLIC PANEL
ALK PHOS: 74 U/L (ref 39–117)
ALT: 60 U/L — AB (ref 0–35)
AST: 23 U/L (ref 0–37)
Albumin: 2.7 g/dL — ABNORMAL LOW (ref 3.5–5.2)
BILIRUBIN TOTAL: 0.7 mg/dL (ref 0.3–1.2)
BUN: 12 mg/dL (ref 6–23)
CO2: 23 mEq/L (ref 19–32)
CREATININE: 0.79 mg/dL (ref 0.50–1.10)
Calcium: 8.4 mg/dL (ref 8.4–10.5)
Chloride: 105 mEq/L (ref 96–112)
GFR calc Af Amer: 89 mL/min — ABNORMAL LOW (ref >90)
GFR, EST NON AFRICAN AMERICAN: 77 mL/min — AB (ref >90)
GLUCOSE: 120 mg/dL — AB (ref 70–99)
POTASSIUM: 4.4 meq/L (ref 3.7–5.3)
Sodium: 140 mEq/L (ref 137–147)
Total Protein: 6.1 g/dL (ref 6.0–8.3)

## 2013-01-26 LAB — CBC
HEMATOCRIT: 29.7 % — AB (ref 36.0–46.0)
HEMOGLOBIN: 9.7 g/dL — AB (ref 12.0–15.0)
MCH: 30.1 pg (ref 26.0–34.0)
MCHC: 32.7 g/dL (ref 30.0–36.0)
MCV: 92.2 fL (ref 78.0–100.0)
Platelets: 252 10*3/uL (ref 150–400)
RBC: 3.22 MIL/uL — ABNORMAL LOW (ref 3.87–5.11)
RDW: 16.1 % — ABNORMAL HIGH (ref 11.5–15.5)
WBC: 10.8 10*3/uL — ABNORMAL HIGH (ref 4.0–10.5)

## 2013-01-26 MED ORDER — BOOST / RESOURCE BREEZE PO LIQD: 1.0000 | Freq: Three times a day (TID) | ORAL | Status: DC

## 2013-01-26 MED ORDER — BOOST PLUS PO LIQD: 237.0000 mL | Freq: Three times a day (TID) | ORAL | Status: DC

## 2013-01-26 MED ORDER — DSS 100 MG PO CAPS: 100.0000 mg | ORAL_CAPSULE | Freq: Two times a day (BID) | ORAL | Status: DC

## 2013-01-26 MED ORDER — OXYCODONE HCL 5 MG PO TABS
5.0000 mg | ORAL_TABLET | Freq: Four times a day (QID) | ORAL | Status: DC | PRN
Start: 2013-01-26 — End: 2013-03-10

## 2013-01-26 MED ORDER — ACETAMINOPHEN 325 MG PO TABS: 650.0000 mg | ORAL_TABLET | Freq: Four times a day (QID) | ORAL | Status: DC | PRN

## 2013-01-26 NOTE — Progress Notes (Signed)
01/26/13 1540 nsg Pt to d/c to home with daughter. Discharge instructions understood.

## 2013-01-26 NOTE — Discharge Summary (Signed)
Physician Discharge Summary  BRIGITTE SODERBERG YQM:[id number] DOB: [birth year] DOA: 01/21/2013  PCP: Milana Obey, MD  Admit date: 01/21/2013 Discharge date: 01/26/2013  Time spent: Greater than 30 minutes  Recommendations for Outpatient Follow-up:  1. Dr. John Giovanni, PCP in 5 days with repeat labs (CBC and send CMP). 2. Recommend MRI of liver with and without contrast in 4-6 weeks and followup with Dr. Almond Lint, General Surgery (PCP to arrange) if a mass is present in the liver 3. Dr. Judyann Munson, Infectious Disease will followup final blood culture results that were sent from the hospital.  Discharge Diagnoses:  Active Problems:   Essential hypertension   Hyperlipidemia   Hypotension   Transaminitis   Nausea with vomiting   Abdominal pain, right upper quadrant   abnormal electrocardiogram (EKG)   Anemia, unspecified   Lactic acidosis   Thrombocytopenia, unspecified   Subcapsular hemorrhage of liver   Acute renal failure   Positive blood culture   Discharge Condition: Improved & Stable  Diet recommendation: Heart healthy diet  Filed Weights   01/21/13 0930 01/21/13 1130 01/24/13 2047  Weight: 95.255 kg (210 lb) 98.4 kg (216 lb 14.9 oz) 102.967 kg (227 lb)    History of present illness:  80 year old female with history of hypertension, hyperlipidemia, initially presented to [facility] for severe weakness. Prior to hospitalization, she reports 4-5 day history of nausea, vomiting, diarrhea associated with fever and chills. The symptoms resolved prior to admit but she still had weakness. During this time period of being very ill, she reports eating very little food or drinking and was unable to keep down much. She denied worsening abdominal pain with meals. EMS found her to be hypotensive with SBP 70/40-90/48, mildly elevated lactic acid, mild leukocytosis. EKG showed ST depression in anterior and inferior leads. Cardiology evaluated patient and did  not think that she had any primary cardiac issues but her presentation was more concerning for an acute intra-abdominal process and thereby patient was transferred to [facility] for further evaluation.  Hospital Course:   Subcapsular hemorrhage of the liver - Her initial GI presentation, leukocytosis, lactic acidosis, abnormal LFTs was concerning for acute cholecystitis or other acute abdominal process. However subsequent CT revealed hematoma with concerns of possible underlying mass. Unfortunately current bleeding obscures clear delineation. Patient will need followup imaging in 4-6 weeks after discharge and if it shows a mass, will need further evaluation by general surgery. This has been discussed at length with patient's daughter and son at bedside and they verbalized understanding. They will arrange for followup with PCP on Monday. Hemoglobin has remained stable. Patient has minimal intermittent RUQ pain. She however has no nausea or vomiting and has been able to tolerate diet. - Surgeons consulted. They saw no evidence of cholecystitis on ultrasound or CT. No surgical needs were seen.  Hypotension  - multi-factorial due to hypovolemia and some acute blood loss anemia. This resolved after IV fluid hydration. Her home blood pressure medications have been resumed except for diuretics secondary to poor oral intake. This can be reinitiated outpatient as deemed necessary.  Lactic acidosis  - due to to dehydration and hypotension.   Nausea and vomiting - Unclear if this was due to acute viral gastroenteritis or due to subcapsular hemorrhage of liver. Resolved and patient is tolerating diet.  Hypokalemia - Replaced  Acute blood loss anemia - Stable after 2 units packed red blood cell transfusion for hemoglobin of 7.4.  Acute renal failure - Secondary to  dehydration and hypotension. Resolved after IV fluids. Resume ACE inhibitors for blood pressure but diuretics were continued to be  held. Followup BMP in a few days with PCP.  Single positive blood culture/coagulase negative staph - Unfortunately only a single blood culture was obtained on 1/19. She was empirically started on IV vancomycin. Subsequent blood cultures x2 from 1/20 negative to date. As per ID recommendations, all antibiotics were discontinued for couple days and blood culture was repeated on 1/24. Patient had a low-grade temperature of 100.69F on 1/23 but no other clinical signs or symptoms suggestive of infection. Her low-grade fever could also be secondary to the subcapsular hemorrhage of the liver. Discussed with Dr. Drue Second and agreed that patient could be discharged home and she will followup final blood culture results.  Hypertension - Resumed home medications and improved control.  CAD - Cardiology evaluated. No chest pains, troponin negative, EKG showed LVH with extensive repolarization abnormalities. Likely mild demand ischemia from hypotension and anemia. Cardiology evaluated and did not see any acute issues. Cardiology has signed off.  Thrombocytopenia - Likely due to consumption. Resolved.   Consultations:  Cardiology    general surgery  Infectious disease  Procedures:  None    Discharge Exam:  Complaints:  Occasional mild RUQ pain without nausea or vomiting. Tolerating diet. Denies fever or chills.   Filed Vitals:   01/25/13 1700 01/25/13 2045 01/26/13 0408 01/26/13 0900  BP: 156/57 151/73 151/79 147/68  Pulse: 77 87 87 92  Temp: 100.6 F (38.1 C) 98.8 F (37.1 C) 98.8 F (37.1 C) 98.2 F (36.8 C)  TempSrc: Oral Oral Oral Oral  Resp: 20 32 28 22  Height:      Weight:      SpO2: 97% 92% 90% 99%    General: No acute respiratory distress  Lungs: Clear to auscultation bilaterally without wheezes or crackles, RA  Cardiovascular: Regular rate and rhythm without murmur gallop or rub normal S1 and S2, no peripheral edema  Abdomen: Mildly tender RUQ without acute peritoneal  signs. Normal bowel sounds heard.  Musculoskeletal: No significant cyanosis, clubbing of bilateral lower extremities  Neurological: Alert and oriented x 3, moves all extremities x 4 without focal neurological deficits   Discharge Instructions      Discharge Orders   Future Orders Complete By Expires   Call MD for:  difficulty breathing, headache or visual disturbances  As directed    Call MD for:  extreme fatigue  As directed    Call MD for:  persistant dizziness or light-headedness  As directed    Call MD for:  persistant nausea and vomiting  As directed    Call MD for:  severe uncontrolled pain  As directed    Call MD for:  temperature >100.4  As directed    Diet - low sodium heart healthy  As directed    Increase activity slowly  As directed        Medication List    STOP taking these medications       furosemide 40 MG tablet  Commonly known as:  LASIX     OSTEO BI-FLEX ADV TRIPLE ST Tabs      TAKE these medications       acetaminophen 325 MG tablet  Commonly known as:  TYLENOL  Take 2 tablets (650 mg total) by mouth every 6 (six) hours as needed for mild pain or fever (or Fever >/= 101).     atenolol 25 MG tablet  Commonly known as:  TENORMIN  Take 25 mg by mouth daily.     atorvastatin 40 MG tablet  Commonly known as:  LIPITOR  Take 40 mg by mouth at bedtime.     diltiazem 180 MG 24 hr capsule  Commonly known as:  DILACOR XR  Take 180 mg by mouth daily.     DSS 100 MG Caps  Take 100 mg by mouth 2 (two) times daily.     feeding supplement (RESOURCE BREEZE) Liqd  Take 1 Container by mouth 3 (three) times daily between meals.     lactose free nutrition Liqd  Take 237 mLs by mouth 3 (three) times daily with meals.     lisinopril 20 MG tablet  Commonly known as:  PRINIVIL,ZESTRIL  Take 20 mg by mouth daily.     LORazepam 0.5 MG tablet  Commonly known as:  ATIVAN  Take 0.5-1 mg by mouth at bedtime as needed for sleep.     omeprazole 40 MG capsule   Commonly known as:  PRILOSEC  Take 40 mg by mouth 2 (two) times daily.     oxyCODONE 5 MG immediate release tablet  Commonly known as:  Oxy IR/ROXICODONE  Take 1 tablet (5 mg total) by mouth every 6 (six) hours as needed for moderate pain or severe pain.     potassium chloride SA 20 MEQ tablet  Commonly known as:  K-DUR,KLOR-CON  Take 20 mEq by mouth daily. Takes it with the lasix     PX IRON 27 MG Tabs  Generic drug:  Ferrous Sulfate  Take 1 tablet by mouth daily.     Vitamin D (Ergocalciferol) 50000 UNITS Caps capsule  Commonly known as:  DRISDOL  Take 50,000 Units by mouth every 7 (seven) days.     VITAMIN E COMPLETE Caps  Take 1 capsule by mouth daily.       Follow-up Information   Follow up with Milana Obey, MD. Schedule an appointment as soon as possible for a visit in 5 days. (To be seen with repeat labs (CBC & CMP). Will need an MRI of the liver in 4 to 6 weeks. )    Specialty:  Family Medicine   Contact information:   [address] [place] [address] [phone]        The results of significant diagnostics from this hospitalization (including imaging, microbiology, ancillary and laboratory) are listed below for reference.    Significant Diagnostic Studies: Ct Abdomen Pelvis W Wo Contrast  01/22/2013   CLINICAL DATA:  Liver mass on ultrasound.  Abdominal pain.  EXAM: CT ABDOMEN AND PELVIS WITHOUT AND WITH CONTRAST  TECHNIQUE: Multidetector CT imaging of the abdomen and pelvis was performed without contrast material in one or both body regions, followed by contrast material(s) and further sections in one or both body regions.  CONTRAST:  80mL OMNIPAQUE IOHEXOL 300 MG/ML  SOLN  COMPARISON:  Abdominal ultrasound dated 01/21/2013  FINDINGS: Trace right pleural effusion.  14.3 x 7.6 x 14.0 cm mixed density subcapsular hematoma along the lateral aspect of the right liver (series 2/image 33). Additional associated mixed density hemorrhage along  the posterior aspect of the right hepatic lobe (series 2/image 15) and along the right hepatic dome (series 2/image 10). This appearance is worrisome for an underlying mass, possibly along the hepatic dome (series 10/image 48), although the mass (if present) is obscured by hemorrhage.  Following contrast administration, there are no findings suspicious for active extravasation.  Spleen, pancreas, and adrenal  glands are within normal limits.  3.1 cm gallstone (series 13/image 64). No associated inflammatory changes. No intrahepatic or extrahepatic ductal dilatation.  Kidneys are within normal limits.  No hydronephrosis.  No evidence of bowel obstruction. Duodenal diverticulum (series 3/image 58). Normal appendix. Sigmoid diverticulosis, without associated inflammatory changes.  Atherosclerotic calcifications of the abdominal aorta and branch vessels.  Small volume hemorrhage along the inferior liver, right paracolic gutter, and pelvis.  No suspicious abdominopelvic lymphadenopathy.  Status post hysterectomy.  No adnexal masses.  Bladder is within normal limits.  Degenerative changes of the visualized thoracolumbar spine.  IMPRESSION: 14.3 cm mixed density subcapsular hematoma along the lateral aspect of the right liver. Associated mixed density hemorrhage along the right hepatic dome and posterior aspect of the right hepatic lobe. No evidence of active extravasation.  This appearance is worrisome for an underlying mass, possibly along the hepatic dome, although the mass (if present) is obscured by hemorrhage. Consider follow-up MRI abdomen with/without contrast when hematoma has improved/resolved.  Cholelithiasis, without associated findings to suggest acute cholecystitis.  These results were called by telephone at the time of interpretation on 01/22/2013 at 8:38 AM to Dr. Butler Denmark, who verbally acknowledged these results.   Electronically Signed   By: Charline Bills M.D.   On: 01/22/2013 08:39   US Abdomen  Complete  01/21/2013   CLINICAL DATA:  Right upper quadrant pain  EXAM: ULTRASOUND ABDOMEN COMPLETE  COMPARISON:  None.  FINDINGS: Gallbladder:  Gallstone stone within the gallbladder. No sonographic Murphy sign noted.  Common bile duct:  Diameter: 4.3 mm  Liver:  There is a complex cystic lesion in the liver measuring 13.4 x 6.4 x 12.5 cm.  IVC:  No abnormality visualized.  Pancreas:  Visualized portion unremarkable.  Spleen:  Size and appearance within normal limits.  Right Kidney:  Length: 11.3 cm. Echogenicity within normal limits. No mass or hydronephrosis visualized.  Left Kidney:  Length: 11.2 cm. Echogenicity within normal limits. No mass or hydronephrosis visualized.  Abdominal aorta:  No aneurysm visualized.  Bifurcation not seen  Other findings:  Perisplenic ascites.  IMPRESSION: There is a complex cystic lesion in the liver measuring 13.4 x 6.4 x 12.5 cm. Further evaluation with three-phase liver CT is recommended.  Cholelithiasis without sonographic evidence of acute cholecystitis.   Electronically Signed   By: Sherian Rein M.D.   On: 01/21/2013 15:59    Microbiology: Recent Results (from the past 240 hour(s))  CULTURE, BLOOD (ROUTINE X 2)     Status: None   Collection Time    01/21/13 10:20 AM      Result Value Range Status   Specimen Description BLOOD LEFT IJ COLLECTED BY DOCTOR WARD   Final   Special Requests BOTTLES DRAWN AEROBIC AND ANAEROBIC 8CC   Final   Culture  Setup Time     Final   Value: 01/22/2013 02:55     Performed at [facility]   Culture     Final   Value: STAPHYLOCOCCUS SPECIES (COAGULASE NEGATIVE)     Note: THE SIGNIFICANCE OF ISOLATING THIS ORGANISM FROM A SINGLE VENIPUNCTURE CANNOT BE PREDICTED WITHOUT FURTHER CLINICAL AND CULTURE CORRELATION. SUSCEPTIBILITIES AVAILABLE ONLY ON REQUEST.     Note: Gram Stain Report Called to,Read Back By and Verified With: Franchot Gallo AT [facility] 1610 01/22/13 BY Berton Lan K     Performed at [facility]   Report Status  01/23/2013 FINAL   Final  MRSA PCR SCREENING     Status: None  Collection Time    01/21/13 11:33 AM      Result Value Range Status   MRSA by PCR NEGATIVE  NEGATIVE Final   Comment:            The GeneXpert MRSA Assay (FDA     approved for NASAL specimens     only), is one component of a     comprehensive MRSA colonization     surveillance program. It is not     intended to diagnose MRSA     infection nor to guide or     monitor treatment for     MRSA infections.  URINE CULTURE     Status: None   Collection Time    01/21/13 10:37 PM      Result Value Range Status   Specimen Description URINE, CLEAN CATCH   Final   Special Requests NONE   Final   Culture  Setup Time     Final   Value: 01/21/2013 23:47     Performed at [facility]   Colony Count     Final   Value: NO GROWTH     Performed at [facility]   Culture     Final   Value: NO GROWTH     Performed at [facility]   Report Status 01/23/2013 FINAL   Final  CULTURE, BLOOD (ROUTINE X 2)     Status: None   Collection Time    01/22/13  9:10 AM      Result Value Range Status   Specimen Description BLOOD RIGHT ARM   Final   Special Requests BOTTLES DRAWN AEROBIC AND ANAEROBIC 10CC   Final   Culture  Setup Time     Final   Value: 01/22/2013 13:30     Performed at [facility]   Culture     Final   Value:        BLOOD CULTURE RECEIVED NO GROWTH TO DATE CULTURE WILL BE HELD FOR 5 DAYS BEFORE ISSUING A FINAL NEGATIVE REPORT     Performed at [facility]   Report Status PENDING   Incomplete  CULTURE, BLOOD (ROUTINE X 2)     Status: None   Collection Time    01/22/13  9:25 AM      Result Value Range Status   Specimen Description BLOOD RIGHT HAND   Final   Special Requests BOTTLES DRAWN AEROBIC AND ANAEROBIC 10CC   Final   Culture  Setup Time     Final   Value: 01/22/2013 13:32     Performed at [facility]   Culture     Final   Value:        BLOOD CULTURE RECEIVED NO  GROWTH TO DATE CULTURE WILL BE HELD FOR 5 DAYS BEFORE ISSUING A FINAL NEGATIVE REPORT     Performed at [facility]   Report Status PENDING   Incomplete     Labs: Basic Metabolic Panel:  Recent Labs Lab 01/21/13 1020 01/22/13 0145 01/23/13 0305 01/24/13 0311 01/26/13 0523  NA 143 143 141 142 140  K 3.0* 3.6* 4.1 4.6 4.4  CL 101 105 106 108 105  CO2 26 26 23 22 23   GLUCOSE 160* 108* 101* 98 120*  BUN 27* 23 12 11 12   CREATININE 1.34* 1.02 0.77 0.73 0.79  CALCIUM 8.0* 7.5* 8.0* 8.3* 8.4   Liver Function Tests:  Recent Labs Lab 01/21/13 1020 01/22/13 0145 01/23/13 0305 01/26/13 [id number]  AST 102* 74* 42* 23  ALT 228* 176* 124* 60*  ALKPHOS 62 64 69 74  BILITOT 1.1 1.1 1.0 0.7  PROT 5.7* 5.3* 5.5* 6.1  ALBUMIN 3.1* 2.8* 2.7* 2.7*    Recent Labs Lab 01/21/13 1020  LIPASE 67*   No results found for this basename: AMMONIA,  in the last 168 hours CBC:  Recent Labs Lab 01/21/13 1020  01/22/13 0145 01/23/13 0305 01/24/13 0311 01/25/13 0555 01/26/13 0523  WBC 14.5*  --  12.2* 11.8* 10.7* 12.2* 10.8*  NEUTROABS 9.9*  --   --   --   --   --   --   HGB 7.4*  < > 9.2* 9.0* 8.7* 9.7* 9.7*  HCT 23.6*  < > 26.8* 27.4* 27.0* 30.2* 29.7*  MCV 97.5  --  89.9 92.9 94.1 92.9 92.2  PLT 93*  --  95* 138* 158 226 252  < > = values in this interval not displayed. Cardiac Enzymes:  Recent Labs Lab 01/21/13 1020 01/21/13 1421 01/22/13 0145  TROPONINI <0.30 <0.30 <0.30   BNP: BNP (last 3 results) No results found for this basename: PROBNP,  in the last 8760 hours CBG: No results found for this basename: GLUCAP,  in the last 168 hours  Additional labs: 1. Anemia panel: Iron 51, TIBC 240, ferritin 118, folate >20 & vitamin B12: 412. Reticulocyte count 74  2.  AFP: 9  3.  FOBT: Negative 4. 2-D echo 01/21/13: Study Conclusions  - Left ventricle: The cavity size was normal. Moderate to severe focal basal septal hypertrophy. No significant LV outflow tract  gradient and no mitral valve systolic anterior motion. Systolic function was vigorous. The estimated ejection fraction was in the range of 65% to 70%. Wall motion was normal; there were no regional wall motion abnormalities. Doppler parameters are consistent with abnormal left ventricular relaxation (grade 1 diastolic dysfunction). - Aortic valve: There was no stenosis. - Mitral valve: Mildly calcified annulus. Mildly calcified leaflets . Trivial regurgitation. - Left atrium: The atrium was mildly dilated. - Right ventricle: The cavity size was normal. Systolic function was normal. - Tricuspid valve: Peak RV-RA gradient:33mm Hg (S). - Pulmonary arteries: PA peak pressure: 33mm Hg (S). - Inferior vena cava: The vessel was normal in size; the respirophasic diameter changes were in the normal range (= 50%); findings are consistent with normal central venous pressure. Impressions:  - Normal LV size with vigorous systolic function, EF 65-70%. There was moderate to severe focal basal septal hypertrophy without significant LV outflow tract gradient or mitral valve systolic anterior motion. Normal RV size and systolic function. No significant valvular abnormalities. Morphologically consistent with hypertrophic cardiomyopathy.     Signed:  Marcellus Scott, MD, FACP, FHM. [facility] Pager [phone]  If 7PM-7AM, please contact night-coverage [email or web address] Password [facility] 01/26/2013, 12:56 PM

## 2013-01-28 LAB — CULTURE, BLOOD (ROUTINE X 2)
Culture: NO GROWTH
Culture: NO GROWTH

## 2013-01-31 ENCOUNTER — Other Ambulatory Visit (HOSPITAL_COMMUNITY): Payer: Self-pay | Admitting: Family Medicine

## 2013-01-31 DIAGNOSIS — R9389 Abnormal findings on diagnostic imaging of other specified body structures: Secondary | ICD-10-CM

## 2013-01-31 DIAGNOSIS — R1900 Intra-abdominal and pelvic swelling, mass and lump, unspecified site: Secondary | ICD-10-CM

## 2013-02-01 LAB — CULTURE, BLOOD (ROUTINE X 2)
CULTURE: NO GROWTH
Culture: NO GROWTH

## 2013-02-28 ENCOUNTER — Ambulatory Visit (HOSPITAL_COMMUNITY)
Admission: RE | Admit: 2013-02-28 | Discharge: 2013-02-28 | Disposition: A | Discharge status: Home / Self Care | Payer: Medicare Other | Source: Ambulatory Visit | Attending: Family Medicine | Admitting: Family Medicine

## 2013-03-04 ENCOUNTER — Inpatient Hospital Stay (HOSPITAL_COMMUNITY)
Admission: EM | Admit: 2013-03-04 | Discharge: 2013-03-10 | DRG: 392 | Disposition: A | Discharge status: Home / Self Care | Payer: Medicare Other | Attending: Family Medicine | Admitting: Family Medicine

## 2013-03-04 ENCOUNTER — Emergency Department (HOSPITAL_COMMUNITY): Payer: Medicare Other

## 2013-03-04 ENCOUNTER — Encounter (HOSPITAL_COMMUNITY): Payer: Self-pay | Admitting: Emergency Medicine

## 2013-03-04 DIAGNOSIS — E872 Acidosis, unspecified: Secondary | ICD-10-CM | POA: Diagnosis present

## 2013-03-04 DIAGNOSIS — K2100 Gastro-esophageal reflux disease with esophagitis, without bleeding: Secondary | ICD-10-CM | POA: Diagnosis present

## 2013-03-04 DIAGNOSIS — K2970 Gastritis, unspecified, without bleeding: Principal | ICD-10-CM | POA: Diagnosis present

## 2013-03-04 DIAGNOSIS — R1110 Vomiting, unspecified: Secondary | ICD-10-CM

## 2013-03-04 DIAGNOSIS — K8020 Calculus of gallbladder without cholecystitis without obstruction: Secondary | ICD-10-CM | POA: Diagnosis present

## 2013-03-04 DIAGNOSIS — R109 Unspecified abdominal pain: Secondary | ICD-10-CM

## 2013-03-04 DIAGNOSIS — R5381 Other malaise: Secondary | ICD-10-CM

## 2013-03-04 DIAGNOSIS — K222 Esophageal obstruction: Secondary | ICD-10-CM | POA: Diagnosis present

## 2013-03-04 DIAGNOSIS — K2990 Gastroduodenitis, unspecified, without bleeding: Principal | ICD-10-CM

## 2013-03-04 DIAGNOSIS — K228 Other specified diseases of esophagus: Secondary | ICD-10-CM | POA: Diagnosis present

## 2013-03-04 DIAGNOSIS — K2289 Other specified disease of esophagus: Secondary | ICD-10-CM | POA: Diagnosis present

## 2013-03-04 DIAGNOSIS — E876 Hypokalemia: Secondary | ICD-10-CM | POA: Diagnosis present

## 2013-03-04 DIAGNOSIS — R531 Weakness: Secondary | ICD-10-CM

## 2013-03-04 DIAGNOSIS — R1011 Right upper quadrant pain: Secondary | ICD-10-CM

## 2013-03-04 DIAGNOSIS — Z833 Family history of diabetes mellitus: Secondary | ICD-10-CM

## 2013-03-04 DIAGNOSIS — R112 Nausea with vomiting, unspecified: Secondary | ICD-10-CM | POA: Diagnosis present

## 2013-03-04 DIAGNOSIS — I10 Essential (primary) hypertension: Secondary | ICD-10-CM | POA: Diagnosis present

## 2013-03-04 DIAGNOSIS — E785 Hyperlipidemia, unspecified: Secondary | ICD-10-CM | POA: Diagnosis present

## 2013-03-04 DIAGNOSIS — K449 Diaphragmatic hernia without obstruction or gangrene: Secondary | ICD-10-CM | POA: Diagnosis present

## 2013-03-04 DIAGNOSIS — R5383 Other fatigue: Secondary | ICD-10-CM

## 2013-03-04 DIAGNOSIS — K7689 Other specified diseases of liver: Secondary | ICD-10-CM | POA: Diagnosis present

## 2013-03-04 HISTORY — DX: Thrombocytopenia, unspecified: D69.6

## 2013-03-04 HISTORY — DX: Acidosis, unspecified: E87.20

## 2013-03-04 HISTORY — DX: Acidosis: E87.2

## 2013-03-04 HISTORY — DX: Elevation of levels of liver transaminase levels: R74.01

## 2013-03-04 HISTORY — DX: Abnormal electrocardiogram (ECG) (EKG): R94.31

## 2013-03-04 HISTORY — DX: Nonspecific elevation of levels of transaminase and lactic acid dehydrogenase (ldh): R74.0

## 2013-03-04 LAB — CBC WITH DIFFERENTIAL/PLATELET
Basophils Absolute: 0 10*3/uL (ref 0.0–0.1)
Basophils Relative: 0 % (ref 0–1)
Eosinophils Absolute: 0 10*3/uL (ref 0.0–0.7)
Eosinophils Relative: 0 % (ref 0–5)
HCT: 38.9 % (ref 36.0–46.0)
HEMOGLOBIN: 12.6 g/dL (ref 12.0–15.0)
Lymphocytes Relative: 11 % — ABNORMAL LOW (ref 12–46)
Lymphs Abs: 1.1 10*3/uL (ref 0.7–4.0)
MCH: 30.3 pg (ref 26.0–34.0)
MCHC: 32.4 g/dL (ref 30.0–36.0)
MCV: 93.5 fL (ref 78.0–100.0)
MONOS PCT: 3 % (ref 3–12)
Monocytes Absolute: 0.3 10*3/uL (ref 0.1–1.0)
NEUTROS ABS: 7.9 10*3/uL — AB (ref 1.7–7.7)
Neutrophils Relative %: 85 % — ABNORMAL HIGH (ref 43–77)
PLATELETS: 369 10*3/uL (ref 150–400)
RBC: 4.16 MIL/uL (ref 3.87–5.11)
RDW: 16 % — ABNORMAL HIGH (ref 11.5–15.5)
WBC: 9.3 10*3/uL (ref 4.0–10.5)

## 2013-03-04 LAB — COMPREHENSIVE METABOLIC PANEL
ALBUMIN: 3.4 g/dL — AB (ref 3.5–5.2)
ALK PHOS: 79 U/L (ref 39–117)
ALT: 13 U/L (ref 0–35)
AST: 15 U/L (ref 0–37)
BILIRUBIN TOTAL: 0.5 mg/dL (ref 0.3–1.2)
BUN: 15 mg/dL (ref 6–23)
CHLORIDE: 99 meq/L (ref 96–112)
CO2: 25 mEq/L (ref 19–32)
Calcium: 9.5 mg/dL (ref 8.4–10.5)
Creatinine, Ser: 0.61 mg/dL (ref 0.50–1.10)
GFR calc Af Amer: >90 mL/min (ref >90)
GFR calc non Af Amer: 84 mL/min — ABNORMAL LOW (ref >90)
Glucose, Bld: 170 mg/dL — ABNORMAL HIGH (ref 70–99)
Potassium: 3.6 mEq/L — ABNORMAL LOW (ref 3.7–5.3)
SODIUM: 139 meq/L (ref 137–147)
Total Protein: 7.8 g/dL (ref 6.0–8.3)

## 2013-03-04 LAB — TROPONIN I
Troponin I: <0.3 ng/mL (ref <0.30)
Troponin I: <0.3 ng/mL (ref <0.30)

## 2013-03-04 LAB — BLOOD GAS, VENOUS
ACID-BASE DEFICIT: 0.4 mmol/L (ref 0.0–2.0)
BICARBONATE: 22.6 meq/L (ref 20.0–24.0)
Drawn by: 25788
O2 SAT: 91.4 %
PO2 VEN: 61.6 mmHg — AB (ref 30.0–45.0)
TCO2: 19.8 mmol/L (ref 0–100)
pCO2, Ven: 33.6 mmHg — ABNORMAL LOW (ref 45.0–50.0)
pH, Ven: 7.443 — ABNORMAL HIGH (ref 7.250–7.300)

## 2013-03-04 LAB — URINALYSIS, ROUTINE W REFLEX MICROSCOPIC
BILIRUBIN URINE: NEGATIVE
GLUCOSE, UA: NEGATIVE mg/dL
HGB URINE DIPSTICK: NEGATIVE
Ketones, ur: 15 mg/dL — AB
Leukocytes, UA: NEGATIVE
Nitrite: NEGATIVE
PROTEIN: NEGATIVE mg/dL
Specific Gravity, Urine: 1.02 (ref 1.005–1.030)
Urobilinogen, UA: 0.2 mg/dL (ref 0.0–1.0)
pH: 6.5 (ref 5.0–8.0)

## 2013-03-04 LAB — PROTIME-INR
INR: 1.07 (ref 0.00–1.49)
PROTHROMBIN TIME: 13.7 s (ref 11.6–15.2)

## 2013-03-04 LAB — LACTIC ACID, PLASMA: LACTIC ACID, VENOUS: 2.4 mmol/L — AB (ref 0.5–2.2)

## 2013-03-04 LAB — SAMPLE TO BLOOD BANK

## 2013-03-04 LAB — LIPASE, BLOOD: LIPASE: 15 U/L (ref 11–59)

## 2013-03-04 MED ORDER — SODIUM CHLORIDE 0.9 % IV BOLUS (SEPSIS)
1000.0000 mL | Freq: Once | INTRAVENOUS | Status: AC
  Administered 2013-03-04: 1000 mL via INTRAVENOUS

## 2013-03-04 MED ORDER — ONDANSETRON HCL 4 MG/2ML IJ SOLN: 4.0000 mg | Freq: Three times a day (TID) | INTRAMUSCULAR | Status: DC | PRN

## 2013-03-04 MED ORDER — HYDRALAZINE HCL 20 MG/ML IJ SOLN
10.0000 mg | Freq: Four times a day (QID) | INTRAMUSCULAR | Status: DC | PRN
  Administered 2013-03-04 – 2013-03-05 (×2): 10 mg via INTRAVENOUS
  Filled 2013-03-04 (×2): qty 1

## 2013-03-04 MED ORDER — ONDANSETRON HCL 4 MG/2ML IJ SOLN
4.0000 mg | Freq: Four times a day (QID) | INTRAMUSCULAR | Status: DC | PRN
  Administered 2013-03-05 – 2013-03-07 (×4): 4 mg via INTRAVENOUS
  Filled 2013-03-04 (×6): qty 2

## 2013-03-04 MED ORDER — IOHEXOL 300 MG/ML  SOLN
100.0000 mL | Freq: Once | INTRAMUSCULAR | Status: AC | PRN
  Administered 2013-03-04: 100 mL via INTRAVENOUS

## 2013-03-04 MED ORDER — BOOST / RESOURCE BREEZE PO LIQD
1.0000 | Freq: Three times a day (TID) | ORAL | Status: DC
  Administered 2013-03-08 – 2013-03-10 (×4): 1 via ORAL

## 2013-03-04 MED ORDER — BOOST HIGH PROTEIN PO LIQD
237.0000 mL | Freq: Three times a day (TID) | ORAL | Status: DC
  Filled 2013-03-04 (×2): qty 237

## 2013-03-04 MED ORDER — OXYCODONE HCL 5 MG PO TABS
5.0000 mg | ORAL_TABLET | Freq: Four times a day (QID) | ORAL | Status: DC | PRN
  Administered 2013-03-06 – 2013-03-10 (×9): 5 mg via ORAL
  Filled 2013-03-04 (×10): qty 1

## 2013-03-04 MED ORDER — SODIUM CHLORIDE 0.9 % IV BOLUS (SEPSIS): 1000.0000 mL | Freq: Once | INTRAVENOUS | Status: DC

## 2013-03-04 MED ORDER — LISINOPRIL 10 MG PO TABS
20.0000 mg | ORAL_TABLET | Freq: Every day | ORAL | Status: DC
  Administered 2013-03-05 – 2013-03-06 (×2): 20 mg via ORAL
  Filled 2013-03-04 (×2): qty 2

## 2013-03-04 MED ORDER — ATORVASTATIN CALCIUM 40 MG PO TABS: 40.0000 mg | ORAL_TABLET | Freq: Every day | ORAL | Status: DC

## 2013-03-04 MED ORDER — ENOXAPARIN SODIUM 40 MG/0.4ML ~~LOC~~ SOLN
40.0000 mg | SUBCUTANEOUS | Status: DC
  Administered 2013-03-04 – 2013-03-05 (×2): 40 mg via SUBCUTANEOUS
  Filled 2013-03-04 (×2): qty 0.4

## 2013-03-04 MED ORDER — IOHEXOL 300 MG/ML  SOLN
50.0000 mL | Freq: Once | INTRAMUSCULAR | Status: AC | PRN
  Administered 2013-03-04: 50 mL via ORAL

## 2013-03-04 MED ORDER — ATENOLOL 25 MG PO TABS
25.0000 mg | ORAL_TABLET | Freq: Every day | ORAL | Status: DC
  Filled 2013-03-04: qty 1

## 2013-03-04 MED ORDER — ONDANSETRON HCL 4 MG/2ML IJ SOLN
4.0000 mg | Freq: Once | INTRAMUSCULAR | Status: AC
  Administered 2013-03-04: 4 mg via INTRAVENOUS
  Filled 2013-03-04: qty 2

## 2013-03-04 MED ORDER — PROMETHAZINE HCL 25 MG/ML IJ SOLN
12.5000 mg | Freq: Once | INTRAMUSCULAR | Status: AC
  Administered 2013-03-04: 12.5 mg via INTRAVENOUS
  Filled 2013-03-04: qty 1

## 2013-03-04 MED ORDER — ONDANSETRON HCL 4 MG PO TABS: 4.0000 mg | ORAL_TABLET | Freq: Four times a day (QID) | ORAL | Status: DC | PRN

## 2013-03-04 MED ORDER — SODIUM CHLORIDE 0.9 % IV SOLN
INTRAVENOUS | Status: DC
  Administered 2013-03-04 – 2013-03-05 (×3): via INTRAVENOUS

## 2013-03-04 MED ORDER — MORPHINE SULFATE 2 MG/ML IJ SOLN
2.0000 mg | INTRAMUSCULAR | Status: DC | PRN
  Administered 2013-03-06 – 2013-03-07 (×3): 2 mg via INTRAVENOUS
  Filled 2013-03-04 (×4): qty 1

## 2013-03-04 MED ORDER — SODIUM CHLORIDE 0.9 % IV SOLN: INTRAVENOUS | Status: AC

## 2013-03-04 MED ORDER — DILTIAZEM HCL ER COATED BEADS 180 MG PO CP24
180.0000 mg | ORAL_CAPSULE | Freq: Every day | ORAL | Status: DC
  Administered 2013-03-05: 180 mg via ORAL
  Filled 2013-03-04 (×3): qty 1

## 2013-03-04 MED ORDER — LORAZEPAM 0.5 MG PO TABS
0.5000 mg | ORAL_TABLET | Freq: Every evening | ORAL | Status: DC | PRN
  Administered 2013-03-05 – 2013-03-06 (×2): 1 mg via ORAL
  Filled 2013-03-04 (×2): qty 2

## 2013-03-04 NOTE — ED Notes (Signed)
Patient arrives via EMS from home with c/o generalized weakness that started a 1100 today. CBG 185. Patient c/o nausea, vomited x 3. 4 mg Zofran given by EMS. Patient hyperventilating (32 respirations per minute). Patient discharged from hospital for lactic acidosis 01/26/13, did not have prescriptions filled. Alert/oriented x 3.

## 2013-03-04 NOTE — ED Provider Notes (Signed)
TIME SEEN: 2:30 PM  CHIEF COMPLAINT: Generalized weakness, nausea and vomiting  HPI: Patient is a 80 year old female with a history of hypertension, hyperlipidemia was recently admitted to the hospital 01/22/52 hypotension, lactic acidosis, anemia and was transfused 2 units during her hospital stay.  She was also found to have a subcapsular hematoma do to a liver mass and has a scheduled MRI tomorrow.  She presents emergency department today with 5-6 episodes of nonbloody, nonbilious vomiting that started at 10 AM.  She is feeling for a week.  She states she had a normal bowel movement yesterday that was nonbloody, no melena.  She is denying any chest pain or shortness of breath, abdominal pain, focal numbness or weakness.  She denies dysuria, hematuria, vaginal bleeding or discharge.  She denies any sick contacts.  Denies any recent travel.  She is on antibiotics.  Initially patient was tachypneic but this resolved upon my evaluation.  ROS: See HPI Constitutional: no fever  Eyes: no drainage  ENT: no runny nose   Cardiovascular:  no chest pain  Resp: no SOB  GI: vomiting GU: no dysuria Integumentary: no rash  Allergy: no hives  Musculoskeletal: no leg swelling  Neurological: no slurred speech ROS otherwise negative  PAST MEDICAL HISTORY/PAST SURGICAL HISTORY:  Past Medical History  Diagnosis Date  . Hyperlipidemia   . Hypertension   . Thrombocytopenia   . Lactic acidosis   . Abnormal EKG   . Transaminitis     MEDICATIONS:  Prior to Admission medications   Medication Sig Start Date End Date Taking? Authorizing Provider  acetaminophen (TYLENOL) 325 MG tablet Take 2 tablets (650 mg total) by mouth every 6 (six) hours as needed for mild pain or fever (or Fever >/= 101). 01/26/13   Elease Etienne, MD  atenolol (TENORMIN) 25 MG tablet Take 25 mg by mouth daily.    Historical Provider, MD  atorvastatin (LIPITOR) 40 MG tablet Take 40 mg by mouth at bedtime.    Historical Provider, MD   diltiazem (DILACOR XR) 180 MG 24 hr capsule Take 180 mg by mouth daily.    Historical Provider, MD  docusate sodium 100 MG CAPS Take 100 mg by mouth 2 (two) times daily. 01/26/13   Elease Etienne, MD  feeding supplement, RESOURCE BREEZE, (RESOURCE BREEZE) LIQD Take 1 Container by mouth 3 (three) times daily between meals. 01/26/13   Elease Etienne, MD  Ferrous Sulfate (PX IRON) 27 MG TABS Take 1 tablet by mouth daily.    Historical Provider, MD  lactose free nutrition (BOOST PLUS) LIQD Take 237 mLs by mouth 3 (three) times daily with meals. 01/26/13   Elease Etienne, MD  lisinopril (PRINIVIL,ZESTRIL) 20 MG tablet Take 20 mg by mouth daily.    Historical Provider, MD  LORazepam (ATIVAN) 0.5 MG tablet Take 0.5-1 mg by mouth at bedtime as needed for sleep.    Historical Provider, MD  omeprazole (PRILOSEC) 40 MG capsule Take 40 mg by mouth 2 (two) times daily.    Historical Provider, MD  oxyCODONE (OXY IR/ROXICODONE) 5 MG immediate release tablet Take 1 tablet (5 mg total) by mouth every 6 (six) hours as needed for moderate pain or severe pain. 01/26/13   Elease Etienne, MD  potassium chloride SA (K-DUR,KLOR-CON) 20 MEQ tablet Take 20 mEq by mouth daily. Takes it with the lasix    Historical Provider, MD  Vitamin D, Ergocalciferol, (DRISDOL) 50000 UNITS CAPS capsule Take 50,000 Units by mouth every 7 (seven) days.  Historical Provider, MD  Vitamin Mixture (VITAMIN E COMPLETE) CAPS Take 1 capsule by mouth daily.    Historical Provider, MD    ALLERGIES:  No Known Allergies  SOCIAL HISTORY:  History  Substance Use Topics  . Smoking status: Never Smoker   . Smokeless tobacco: Never Used  . Alcohol Use: No    FAMILY HISTORY: Family History  Problem Relation Age of Onset  . Diabetes Child     EXAM: BP 162/63  Pulse 85  Temp(Src) 98.8 F (37.1 C) (Oral)  Resp 32  Ht 5' 5.5" (1.664 m)  Wt 200 lb (90.719 kg)  BMI 32.76 kg/m2  SpO2 100% CONSTITUTIONAL: Alert and oriented and  responds appropriately to questions. Well-appearing; well-nourished HEAD: Normocephalic EYES: Conjunctivae clear, PERRL, conjunctival pallor ENT: normal nose; no rhinorrhea; slightly dry mucous membranes; pharynx without lesions noted NECK: Supple, no meningismus, no LAD  CARD: RRR; S1 and S2 appreciated; no murmurs, no clicks, no rubs, no gallops RESP: Normal chest excursion without splinting or tachypnea; breath sounds clear and equal bilaterally; no wheezes, no rhonchi, no rales,  ABD/GI: Normal bowel sounds; non-distended; soft, non-tender, no rebound, no guarding BACK:  The back appears normal and is non-tender to palpation, there is no CVA tenderness EXT: Normal ROM in all joints; non-tender to palpation; no edema; normal capillary refill; no cyanosis    SKIN: Normal color for age and race; warm NEURO: Moves all extremities equally, cranial nerves II through XII intact, sensation to light touch intact diffusely PSYCH: The patient's mood and manner are appropriate. Grooming and personal hygiene are appropriate.  MEDICAL DECISION MAKING: Patient here with generalized weakness, nausea vomiting.  Concern for anemia, electrolyte abnormality, possible viral illness.  Abdominal exam is benign.  She is complaining of no chest pain or shortness of breath.  Will obtain EKG, troponin to evaluate for possible anginal equivalent.  Will give IV fluids and Zofran.  ED PROGRESS: The patient's labs are unremarkable other than a mild elevation of her lactate.  Her white count is normal but she does have neutrophil predominance.  Urinalysis pending.  She is now having left lower quadrant pain also complaining of nausea and vomiting.  We'll obtain CT scan of her abdomen and pelvis.   8:24 PM  Pt's urine showed small ketones but no other sign of infection.  CT scan shows cholelithiasis without signs of cholecystitis.  There is also a large subcapsular hemorrhage in the liver that his become better defined and is  smaller today.  No acute hemorrhage.  A right sided pleural effusion has increased in size.  Will obtain chest x-ray to better characterize.  Repeat troponin is pending.  We'll ambulate the patient and p.o. challenge.   8:54 PM  Pt still vomiting after several rounds of IV Zofran.  We'll give small amount of IV Phenergan.  We'll discuss with hospitalist for admission for intractable vomiting.  Second troponin negative.  Patient and family comfortable with this plan.    EKG Interpretation  Date/Time:  Monday March 04 2013 15:18:01 EST Ventricular Rate:  80 PR Interval:  176 QRS Duration: 100 QT Interval:  404 QTC Calculation: 465 R Axis:   43 Text Interpretation:  Sinus rhythm with marked sinus arrhythmia Nonspecific T wave abnormality Abnormal ECG When compared with ECG of 21-Jan-2013 09:33, ST no longer depressed in Inferior leads ST no longer depressed in Anterolateral leads T wave inversion no longer evident in Inferior leads T wave inversion no longer evident in Anterolateral leads  Confirmed by Elesa Massed,  DO, Christinna Sprung [phone]) on 03/04/2013 3:22:57 PM        Layla Maw Dann Ventress, DO 03/04/13 2113

## 2013-03-04 NOTE — ED Notes (Signed)
Patient assisted with using bedside commode. Urine specimen obtained.

## 2013-03-04 NOTE — ED Notes (Signed)
Family at bedside. Patient up and toileted. Placed back in bed. BP elevated.

## 2013-03-04 NOTE — H&P (Addendum)
[facility] History and Physical  Whitney [name] ZOX:[id number] DOB: 02/16/1931 DOA: 03/04/2013  Referring physician:  PCP: Milana ObeyKNOWLTON,STEPHEN D, MD  Specialists:   Chief Complaint: nausea, vomiting   HPI: Whitney [name] is a 80 y.o. female with PMH of HTN, HPL, hepatic hematoma, recently hospitalized for gastroenteritis vs cholecystitis seen by surgery (recommended to f/u outpatient) presented today with nausea, vomiting >5 times since morning; she also reports mild L sided abdominal discomfort  without acute pain; denies diarrhea, no hematemesis, or hematochezia, no chest pain, no SOB, no dizziness;   Review of Systems: The patient denies anorexia, fever, weight loss,, vision loss, decreased hearing, hoarseness, chest pain, syncope, dyspnea on exertion, peripheral edema, balance deficits, hemoptysis, abdominal pain, melena, hematochezia, severe indigestion/heartburn, hematuria, incontinence, genital sores, muscle weakness, suspicious skin lesions, transient blindness, difficulty walking, depression, unusual weight change, abnormal bleeding, enlarged lymph nodes, angioedema, and breast masses.    Past Medical History  Diagnosis Date  . Hyperlipidemia   . Hypertension   . Thrombocytopenia   . Lactic acidosis   . Abnormal EKG   . Transaminitis    Past Surgical History  Procedure Laterality Date  . Cardiovascular stress test  04/03/2012    Normal study. No evidence of ischemia.  . Abdominal hysterectomy     Social History:  reports that she has never smoked. She has never used smokeless tobacco. She reports that she does not drink alcohol or use illicit drugs. Home;  where does patient live--home, ALF, SNF? and with whom if at home? Yes;  Can patient participate in ADLs?  No Known Allergies  Family History  Problem Relation Age of Onset  . Diabetes Child     (be sure to complete)  Prior to Admission medications   Medication Sig Start Date End Date Taking?  Authorizing Provider  acetaminophen (TYLENOL) 325 MG tablet Take 2 tablets (650 mg total) by mouth every 6 (six) hours as needed for mild pain or fever (or Fever >/= 101). 01/26/13  Yes Elease EtienneAnand D Hongalgi, MD  atenolol (TENORMIN) 25 MG tablet Take 25 mg by mouth daily.   Yes Historical Provider, MD  atorvastatin (LIPITOR) 40 MG tablet Take 40 mg by mouth daily.    Yes Historical Provider, MD  diltiazem (DILACOR XR) 180 MG 24 hr capsule Take 180 mg by mouth daily.   Yes Historical Provider, MD  feeding supplement, RESOURCE BREEZE, (RESOURCE BREEZE) LIQD Take 1 Container by mouth 3 (three) times daily between meals. 01/26/13  Yes Elease EtienneAnand D Hongalgi, MD  lactose free nutrition (BOOST PLUS) LIQD Take 237 mLs by mouth 3 (three) times daily with meals. 01/26/13  Yes Elease EtienneAnand D Hongalgi, MD  lisinopril (PRINIVIL,ZESTRIL) 20 MG tablet Take 20 mg by mouth daily.   Yes Historical Provider, MD  LORazepam (ATIVAN) 0.5 MG tablet Take 0.5-1 mg by mouth at bedtime as needed for sleep.   Yes Historical Provider, MD  oxyCODONE (OXY IR/ROXICODONE) 5 MG immediate release tablet Take 1 tablet (5 mg total) by mouth every 6 (six) hours as needed for moderate pain or severe pain. 01/26/13  Yes Elease EtienneAnand D Hongalgi, MD   Physical Exam: Filed Vitals:   03/04/13 2057  BP: 203/79  Pulse: 100  Temp:   Resp: 16     General:  alert  Eyes: eom-i  ENT: no oral ulcers   Neck: supple   Cardiovascular: s1,s2 rrr  Respiratory: CTA BL  Abdomen: soft, midl RUQ tender, no murphy's, ND, + BS  Skin: no rash  Musculoskeletal: no LE edema  Psychiatric: no hallucinations   Neurologic: CN 2-12 intact, motor 5/5 BL symmetric   Labs on Admission:  Basic Metabolic Panel:  Recent Labs Lab 03/04/13 1422  NA 139  K 3.6*  CL 99  CO2 25  GLUCOSE 170*  BUN 15  CREATININE 0.61  CALCIUM 9.5   Liver Function Tests:  Recent Labs Lab 03/04/13 1422  AST 15  ALT 13  ALKPHOS 79  BILITOT 0.5  PROT 7.8  ALBUMIN 3.4*     Recent Labs Lab 03/04/13 1440  LIPASE 15   No results found for this basename: AMMONIA,  in the last 168 hours CBC:  Recent Labs Lab 03/04/13 1422  WBC 9.3  NEUTROABS 7.9*  HGB 12.6  HCT 38.9  MCV 93.5  PLT 369   Cardiac Enzymes:  Recent Labs Lab 03/04/13 1440 03/04/13 2004  TROPONINI <0.30 <0.30    BNP (last 3 results) No results found for this basename: PROBNP,  in the last 8760 hours CBG: No results found for this basename: GLUCAP,  in the last 168 hours  Radiological Exams on Admission: Dg Chest 2 View  03/04/2013   CLINICAL DATA:  Nausea, vomiting and tachypnea. History of hypertension and thrombocytopenia.  EXAM: CHEST  2 VIEW  COMPARISON:  CT ABD - PELV W/ CM dated 03/04/2013; DG CHEST 2 VIEW dated 05/04/2004  FINDINGS: Subpulmonic right pleural effusion and adjacent right lower lobe atelectasis are unchanged from the earlier CT. The left lung is clear. The heart size and mediastinal contours are stable without evidence of pericardial effusion. There is no pneumothorax.  IMPRESSION: Stable subpulmonic right pleural effusion and right basilar atelectasis.   Electronically Signed   By: Roxy Horseman M.D.   On: 03/04/2013 21:16   Ct Abdomen Pelvis W Contrast  03/04/2013   CLINICAL DATA:  Nausea and generalized weakness with left lower quadrant pain and vomiting  EXAM: CT ABDOMEN AND PELVIS WITH CONTRAST  TECHNIQUE: Multidetector CT imaging of the abdomen and pelvis was performed using the standard protocol following bolus administration of intravenous contrast.  CONTRAST:  OMNIPAQUE IOHEXOL 300 MG/ML SOLN intravenously. The patient did not receive oral contrast material.  COMPARISON:  CT ABD/PELV WO/W dated 01/22/2013  FINDINGS: The subcapsular hemorrhage within the liver is better defined today. Its maximal AP dimension is approximately 11.9 cm. Its maximal transverse dimension is approximately 9.8 cm. Its maximal longitudinal dimension is approximately 12.4 cm. There is  persistent low density in addition to the subcapsular hemorrhage that lies along the dome of the liver and extends along the right lateral surface of the liver. There is no intrahepatic ductal dilation. The liver enhances reasonably well and exhibits no suspicious mass.  The gallbladder is mildly distended and contains a large rim calcified stone which measures approximately 2.4 x 1.6 cm. There is no pericholecystic fluid. The pancreas, spleen, nondistended stomach, adrenal glands, and kidneys are normal in appearance. The caliber of the abdominal aorta is normal. The opacified loops of small and large bowel exhibit no evidence of ileus or of obstruction. There are diverticula throughout the colon, but there are no findings to suggest acute diverticulitis. The urinary bladder and surrounding pelvic soft tissues appear normal. The uterus is surgically absent. There are no adnexal masses. There is no inguinal or umbilical hernia. The psoas musculature is normal in appearance.  There is a small right pleural effusion which has increased in size since the previous study. There is minimal atelectasis adjacent to  the pleural effusion. There is no left pleural effusion. The lumbar vertebral bodies are preserved in height. The bony pelvis exhibits no acute abnormality. There are mild degenerative changes of the SI joints.  IMPRESSION: 1. The large subcapsular hemorrhage within the liver has become better defined and is slightly smaller today. There is no evidence of acute hemorrhage. No hepatic mass is demonstrated. 2. There is a large gallstone present without sonographic evidence of acute cholecystitis. 3. The right-sided pleural effusion is increased in size. There is basilar atelectasis on the right. 4. There are scattered colonic diverticula but there is no evidence of acute diverticulitis.   Electronically Signed   By: [name]  [place]   On: 03/04/2013 20:12    EKG: Independently reviewed.    Assessment/Plan Principal Problem:   Vomiting Active Problems:   Essential hypertension   Hyperlipidemia   Lactic acidosis   80 y.o. female with PMH of HTN, HPL, hepatic hematoma, recently hospitalized for gastroenteritis vs cholecystitis presented today with recurrent episode of nausea, vomiting   1. Nausea, vomiting of unclear etiology ? Cholelithiasis related; afebrile;  -CT abd: there is a large gallstone present without sonographic evidence of acute cholecystitis. -NPO, cont IVF, antiemetics, pain contol; obtain US, HIDA r/o cholecystitis;    2. Hepatic hematoma;  -CT: The large subcapsular hemorrhage within the liver has become better defined and is slightly smaller today. There is no evidence of acute hemorrhage. No hepatic mass is demonstrated -no s/s of acute bleeding; cont monitoring; may need MRI of the liver outpatient   3. HTN uncontrolled on admission;  -patient is on Ca block+ BB, ACE at home  -resume home meds, titrate per response; prn hydralazine   None;  if consultant consulted, please document name and whether formally or informally consulted  Code Status: full (must indicate code status--if unknown or must be presumed, indicate so) Family Communication: d/w patient, her daughter, son (indicate person spoken with, if applicable, with phone number if by telephone) Disposition Plan: home when stable  (indicate anticipated LOS)  Time spent: >35 minutes   Esperanza Sheets [facility] Pager [phone]  If 7PM-7AM, please contact night-coverage [email or web address] Password [facility] 03/04/2013, 9:27 PM

## 2013-03-05 ENCOUNTER — Observation Stay (HOSPITAL_COMMUNITY): Payer: Medicare Other

## 2013-03-05 ENCOUNTER — Inpatient Hospital Stay (HOSPITAL_COMMUNITY): Admission: RE | Admit: 2013-03-05 | Payer: Medicare Other | Source: Ambulatory Visit

## 2013-03-05 ENCOUNTER — Encounter (HOSPITAL_COMMUNITY): Payer: Self-pay

## 2013-03-05 DIAGNOSIS — R112 Nausea with vomiting, unspecified: Secondary | ICD-10-CM | POA: Diagnosis present

## 2013-03-05 LAB — BASIC METABOLIC PANEL
BUN: 9 mg/dL (ref 6–23)
CHLORIDE: 105 meq/L (ref 96–112)
CO2: 23 mEq/L (ref 19–32)
Calcium: 9.1 mg/dL (ref 8.4–10.5)
Creatinine, Ser: 0.54 mg/dL (ref 0.50–1.10)
GFR calc Af Amer: >90 mL/min (ref >90)
GFR calc non Af Amer: 87 mL/min — ABNORMAL LOW (ref >90)
Glucose, Bld: 128 mg/dL — ABNORMAL HIGH (ref 70–99)
Potassium: 3.4 mEq/L — ABNORMAL LOW (ref 3.7–5.3)
SODIUM: 142 meq/L (ref 137–147)

## 2013-03-05 LAB — CBC
HCT: 38.2 % (ref 36.0–46.0)
Hemoglobin: 12.4 g/dL (ref 12.0–15.0)
MCH: 30 pg (ref 26.0–34.0)
MCHC: 32.5 g/dL (ref 30.0–36.0)
MCV: 92.5 fL (ref 78.0–100.0)
PLATELETS: 365 10*3/uL (ref 150–400)
RBC: 4.13 MIL/uL (ref 3.87–5.11)
RDW: 16.1 % — ABNORMAL HIGH (ref 11.5–15.5)
WBC: 12.3 10*3/uL — AB (ref 4.0–10.5)

## 2013-03-05 MED ORDER — SODIUM CHLORIDE 0.9 % IV SOLN
INTRAVENOUS | Status: AC
  Filled 2013-03-05: qty 150

## 2013-03-05 MED ORDER — HYDRALAZINE HCL 20 MG/ML IJ SOLN
20.0000 mg | INTRAMUSCULAR | Status: DC | PRN
  Administered 2013-03-05 – 2013-03-07 (×4): 20 mg via INTRAVENOUS
  Filled 2013-03-05 (×4): qty 1

## 2013-03-05 MED ORDER — STERILE WATER FOR INJECTION IJ SOLN
INTRAMUSCULAR | Status: AC
  Administered 2013-03-05: 1.93 mL via INTRAVENOUS
  Filled 2013-03-05: qty 10

## 2013-03-05 MED ORDER — SINCALIDE 5 MCG IJ SOLR
INTRAMUSCULAR | Status: AC
  Administered 2013-03-05: 1.93 ug via INTRAVENOUS
  Filled 2013-03-05: qty 5

## 2013-03-05 MED ORDER — STERILE WATER FOR INJECTION IJ SOLN
INTRAMUSCULAR | Status: AC
  Filled 2013-03-05: qty 10

## 2013-03-05 MED ORDER — POTASSIUM CHLORIDE IN NACL 40-0.9 MEQ/L-% IV SOLN
INTRAVENOUS | Status: DC
  Administered 2013-03-05 – 2013-03-07 (×5): via INTRAVENOUS

## 2013-03-05 MED ORDER — TECHNETIUM TC 99M MEBROFENIN IV KIT
5.0000 | PACK | Freq: Once | INTRAVENOUS | Status: AC | PRN
  Administered 2013-03-05: 5 via INTRAVENOUS

## 2013-03-05 MED ORDER — GADOBENATE DIMEGLUMINE 529 MG/ML IV SOLN
20.0000 mL | Freq: Once | INTRAVENOUS | Status: AC | PRN
  Administered 2013-03-05: 20 mL via INTRAVENOUS

## 2013-03-05 MED ORDER — ATENOLOL 25 MG PO TABS
25.0000 mg | ORAL_TABLET | Freq: Two times a day (BID) | ORAL | Status: DC
  Administered 2013-03-05 (×2): 25 mg via ORAL
  Filled 2013-03-05: qty 1

## 2013-03-05 MED ORDER — BOOST HIGH PROTEIN PO LIQD
237.0000 mL | Freq: Three times a day (TID) | ORAL | Status: DC
  Administered 2013-03-07 – 2013-03-08 (×3): 1 via ORAL
  Administered 2013-03-08: 11:00:00 via ORAL
  Administered 2013-03-09 – 2013-03-10 (×5): 1 via ORAL
  Filled 2013-03-05 (×19): qty 1

## 2013-03-05 NOTE — Care Management Note (Signed)
    Page 1 of 1   03/05/2013     1:52:46 PM   CARE MANAGEMENT NOTE 03/05/2013  Patient:  Whitney [name],Whitney [name]   Account Number:  [id number]  Date Initiated:  03/05/2013  Documentation initiated by:  Sharrie RothmanBLACKWELL,Kista Robb C  Subjective/Objective Assessment:   Pt admitted from home with vomiting. Pt lives with her daughter and will return home at discharge. Pt has been independent with aDL's.     Action/Plan:   Will continue to follow for discharge planning needs.   Anticipated DC Date:  03/06/2013   Anticipated DC Plan:  HOME/SELF CARE      DC Planning Services  CM consult      Choice offered to / List presented to:             Status of service:  Completed, signed off Medicare Important Message given?   (If response is "NO", the following Medicare IM given date fields will be blank) Date Medicare IM given:   Date Additional Medicare IM given:    Discharge Disposition:  HOME/SELF CARE  Per UR Regulation:    If discussed at Long Length of Stay Meetings, dates discussed:    Comments:  03/05/13 1350 Arlyss Queenammy Izaak Sahr, RN BSN CM

## 2013-03-05 NOTE — Progress Notes (Signed)
Liliane ShiStella A Weidler ZOX:[id number] DOB: 07-29-1931 DOA: 03/04/2013 PCP: Milana ObeyKNOWLTON,STEPHEN D, MD   Subjective: This lady was admitted yesterday with nausea and vomiting. She continues to have this on and off for the last month or so and also has had right sided abdominal discomfort/pain. She was found to have a liver subcapsular hemorrhage, which has now become better defined and slightly smaller than previously documented. The patient was due to have an MRI of the liver today which had been arranged previously. This was to ascertain whether there was a mass present.           Physical Exam: Blood pressure 184/70, pulse 94, temperature 98.4 F (36.9 C), temperature source Oral, resp. rate 24, height 5\' 5"  (1.651 m), weight 96.4 kg (212 lb 8.4 oz), SpO2 97.00%. She looks systemically well. She does not appear to be in acute pain. She is nontoxic or septic. Heart sounds are present without murmurs. Lung fields are clear. Abdomen is soft but is tender over the right upper quadrant with a suggestion of a mass. She is alert and oriented.   Investigations:  No results found for this or any previous visit (from the past 240 hour(s)).   Basic Metabolic Panel:  Recent Labs  81/19/1401/02/17 1422 03/05/13 0607  NA 139 142  K 3.6* 3.4*  CL 99 105  CO2 25 23  GLUCOSE 170* 128*  BUN 15 9  CREATININE 0.61 0.54  CALCIUM 9.5 9.1   Liver Function Tests:  Recent Labs  03/04/13 1422  AST 15  ALT 13  ALKPHOS 79  BILITOT 0.5  PROT 7.8  ALBUMIN 3.4*     CBC:  Recent Labs  03/04/13 1422 03/05/13 0607  WBC 9.3 12.3*  NEUTROABS 7.9*  --   HGB 12.6 12.4  HCT 38.9 38.2  MCV 93.5 92.5  PLT 369 365    Dg Chest 2 View  03/04/2013   CLINICAL DATA:  Nausea, vomiting and tachypnea. History of hypertension and thrombocytopenia.  EXAM: CHEST  2 VIEW  COMPARISON:  CT ABD - PELV W/ CM dated 03/04/2013; DG CHEST 2 VIEW dated 05/04/2004  FINDINGS: Subpulmonic right pleural effusion and adjacent right  lower lobe atelectasis are unchanged from the earlier CT. The left lung is clear. The heart size and mediastinal contours are stable without evidence of pericardial effusion. There is no pneumothorax.  IMPRESSION: Stable subpulmonic right pleural effusion and right basilar atelectasis.   Electronically Signed   By: Roxy HorsemanBill  Veazey M.D.   On: 03/04/2013 21:16   Ct Abdomen Pelvis W Contrast  03/04/2013   CLINICAL DATA:  Nausea and generalized weakness with left lower quadrant pain and vomiting  EXAM: CT ABDOMEN AND PELVIS WITH CONTRAST  TECHNIQUE: Multidetector CT imaging of the abdomen and pelvis was performed using the standard protocol following bolus administration of intravenous contrast.  CONTRAST:  100mL OMNIPAQUE IOHEXOL 300 MG/ML SOLN intravenously. The patient did not receive oral contrast material.  COMPARISON:  CT ABD/PELV WO/W dated 01/22/2013  FINDINGS: The subcapsular hemorrhage within the liver is better defined today. Its maximal AP dimension is approximately 11.9 cm. Its maximal transverse dimension is approximately 9.8 cm. Its maximal longitudinal dimension is approximately 12.4 cm. There is persistent low density in addition to the subcapsular hemorrhage that lies along the dome of the liver and extends along the right lateral surface of the liver. There is no intrahepatic ductal dilation. The liver enhances reasonably well and exhibits no suspicious mass.  The gallbladder is mildly distended and  contains a large rim calcified stone which measures approximately 2.4 x 1.6 cm. There is no pericholecystic fluid. The pancreas, spleen, nondistended stomach, adrenal glands, and kidneys are normal in appearance. The caliber of the abdominal aorta is normal. The opacified loops of small and large bowel exhibit no evidence of ileus or of obstruction. There are diverticula throughout the colon, but there are no findings to suggest acute diverticulitis. The urinary bladder and surrounding pelvic soft tissues  appear normal. The uterus is surgically absent. There are no adnexal masses. There is no inguinal or umbilical hernia. The psoas musculature is normal in appearance.  There is a small right pleural effusion which has increased in size since the previous study. There is minimal atelectasis adjacent to the pleural effusion. There is no left pleural effusion. The lumbar vertebral bodies are preserved in height. The bony pelvis exhibits no acute abnormality. There are mild degenerative changes of the SI joints.  IMPRESSION: 1. The large subcapsular hemorrhage within the liver has become better defined and is slightly smaller today. There is no evidence of acute hemorrhage. No hepatic mass is demonstrated. 2. There is a large gallstone present without sonographic evidence of acute cholecystitis. 3. The right-sided pleural effusion is increased in size. There is basilar atelectasis on the right. 4. There are scattered colonic diverticula but there is no evidence of acute diverticulitis.   Electronically Signed   By: [name]  [place]   On: 03/04/2013 20:12      Medications: I have reviewed the patient's current medications.  Impression: 1. Nausea and vomiting, unclear etiology at the present time. 2. Liver mass versus hemorrhage. 3. Large gallstone present without evidence of acute cholecystitis. 4. Hypertension, uncontrolled.     Plan: 1. MRI scan of the liver. 2. Adjust medications to control blood pressure. 2. Consider gastroenterology and/or surgical consultation.  Consultants:  None at the present time.   Procedures:  None.   Antibiotics:  None.                   Code Status: Full code.  Family Communication: Discussed plan with patient at the bedside.   Disposition Plan: Home when medically stable.  Time spent: 15 minutes.   LOS: 1 day   GOSRANI,NIMISH C   03/05/2013, 8:24 AM

## 2013-03-05 NOTE — Progress Notes (Signed)
UR completed. Patient changed to inpatient- requiring IVF @ 75cc/hr and IV antiemetics.

## 2013-03-06 MED ORDER — AMLODIPINE BESYLATE 5 MG PO TABS
10.0000 mg | ORAL_TABLET | Freq: Every day | ORAL | Status: DC
  Administered 2013-03-06 – 2013-03-10 (×5): 10 mg via ORAL
  Filled 2013-03-06 (×5): qty 2

## 2013-03-06 MED ORDER — ATENOLOL 25 MG PO TABS
50.0000 mg | ORAL_TABLET | Freq: Two times a day (BID) | ORAL | Status: DC
  Administered 2013-03-06 (×2): 50 mg via ORAL
  Filled 2013-03-06 (×2): qty 2

## 2013-03-06 MED ORDER — PANTOPRAZOLE SODIUM 40 MG IV SOLR
40.0000 mg | Freq: Two times a day (BID) | INTRAVENOUS | Status: DC
  Administered 2013-03-06 – 2013-03-08 (×5): 40 mg via INTRAVENOUS
  Filled 2013-03-06 (×5): qty 40

## 2013-03-06 NOTE — Consult Note (Signed)
Reason for Consult: cholelithiasis, large liver hematoma  Referring Physician:  Dr. Joana Reamer is an 80 y.o. female.  HPI:  patient is a 80 year old black female who presented back to the hospital with worsening upper quadrant abdominal pain and nausea. She was recently diagnosed with a large subcapsular hematoma involving a significant part of the right lobe of liver. She also does have a large gallstone. The etiology of this hematoma is not known. She states she has constant right-sided abdominal pain with some nausea. She denies any fever, chills, or jaundice.   Past Medical History  Diagnosis Date  . Hyperlipidemia   . Hypertension   . Thrombocytopenia   . Lactic acidosis   . Abnormal EKG   . Transaminitis     Past Surgical History  Procedure Laterality Date  . Cardiovascular stress test  04/03/2012    Normal study. No evidence of ischemia.  . Abdominal hysterectomy      Family History  Problem Relation Age of Onset  . Diabetes Child     Social History:  reports that she has never smoked. She has never used smokeless tobacco. She reports that she does not drink alcohol or use illicit drugs.  Allergies: No Known Allergies  Medications: I have reviewed the patient's current medications.  Results for orders placed during the hospital encounter of 03/04/13 (from the past 48 hour(s))  CBC WITH DIFFERENTIAL     Status: Abnormal   Collection Time    03/04/13  2:22 PM      Result Value Ref Range   WBC 9.3  4.0 - 10.5 K/uL   RBC 4.16  3.87 - 5.11 MIL/uL   Hemoglobin 12.6  12.0 - 15.0 g/dL   HCT 38.9  36.0 - 46.0 %   MCV 93.5  78.0 - 100.0 fL   MCH 30.3  26.0 - 34.0 pg   MCHC 32.4  30.0 - 36.0 g/dL   RDW 16.0 (*) 11.5 - 15.5 %   Platelets 369  150 - 400 K/uL   Neutrophils Relative % 85 (*) 43 - 77 %   Neutro Abs 7.9 (*) 1.7 - 7.7 K/uL   Lymphocytes Relative 11 (*) 12 - 46 %   Lymphs Abs 1.1  0.7 - 4.0 K/uL   Monocytes Relative 3  3 - 12 %   Monocytes  Absolute 0.3  0.1 - 1.0 K/uL   Eosinophils Relative 0  0 - 5 %   Eosinophils Absolute 0.0  0.0 - 0.7 K/uL   Basophils Relative 0  0 - 1 %   Basophils Absolute 0.0  0.0 - 0.1 K/uL  COMPREHENSIVE METABOLIC PANEL     Status: Abnormal   Collection Time    03/04/13  2:22 PM      Result Value Ref Range   Sodium 139  137 - 147 mEq/L   Potassium 3.6 (*) 3.7 - 5.3 mEq/L   Chloride 99  96 - 112 mEq/L   CO2 25  19 - 32 mEq/L   Glucose, Bld 170 (*) 70 - 99 mg/dL   BUN 15  6 - 23 mg/dL   Creatinine, Ser 0.61  0.50 - 1.10 mg/dL   Calcium 9.5  8.4 - 10.5 mg/dL   Total Protein 7.8  6.0 - 8.3 g/dL   Albumin 3.4 (*) 3.5 - 5.2 g/dL   AST 15  0 - 37 U/L   ALT 13  0 - 35 U/L   Alkaline Phosphatase 79  39 -  117 U/L   Total Bilirubin 0.5  0.3 - 1.2 mg/dL   GFR calc non Af Amer 84 (*) >90 mL/min   GFR calc Af Amer >90  >90 mL/min   Comment: (NOTE)     The eGFR has been calculated using the CKD EPI equation.     This calculation has not been validated in all clinical situations.     eGFR's persistently <90 mL/min signify possible Chronic Kidney     Disease.  SAMPLE TO BLOOD BANK     Status: None   Collection Time    03/04/13  2:40 PM      Result Value Ref Range   Blood Bank Specimen SAMPLE AVAILABLE FOR TESTING     Sample Expiration 03/05/2013    TROPONIN I     Status: None   Collection Time    03/04/13  2:40 PM      Result Value Ref Range   Troponin I <0.30  <0.30 ng/mL   Comment:            Due to the release kinetics of cTnI,     a negative result within the first hours     of the onset of symptoms does not rule out     myocardial infarction with certainty.     If myocardial infarction is still suspected,     repeat the test at appropriate intervals.  LIPASE, BLOOD     Status: None   Collection Time    03/04/13  2:40 PM      Result Value Ref Range   Lipase 15  11 - 59 U/L  PROTIME-INR     Status: None   Collection Time    03/04/13  2:40 PM      Result Value Ref Range   Prothrombin  Time 13.7  11.6 - 15.2 seconds   INR 1.07  0.00 - 1.49  BLOOD GAS, VENOUS     Status: Abnormal   Collection Time    03/04/13  3:07 PM      Result Value Ref Range   pH, Ven 7.443 (*) 7.250 - 7.300   pCO2, Ven 33.6 (*) 45.0 - 50.0 mmHg   pO2, Ven 61.6 (*) 30.0 - 45.0 mmHg   Bicarbonate 22.6  20.0 - 24.0 mEq/L   TCO2 19.8  0 - 100 mmol/L   Acid-base deficit 0.4  0.0 - 2.0 mmol/L   O2 Saturation 91.4     Drawn by [id number]     Sample type VENOUS    LACTIC ACID, PLASMA     Status: Abnormal   Collection Time    03/04/13  3:13 PM      Result Value Ref Range   Lactic Acid, Venous 2.4 (*) 0.5 - 2.2 mmol/L  URINALYSIS, ROUTINE W REFLEX MICROSCOPIC     Status: Abnormal   Collection Time    03/04/13  4:50 PM      Result Value Ref Range   Color, Urine YELLOW  YELLOW   APPearance CLEAR  CLEAR   Specific Gravity, Urine 1.020  1.005 - 1.030   pH 6.5  5.0 - 8.0   Glucose, UA NEGATIVE  NEGATIVE mg/dL   Hgb urine dipstick NEGATIVE  NEGATIVE   Bilirubin Urine NEGATIVE  NEGATIVE   Ketones, ur 15 (*) NEGATIVE mg/dL   Protein, ur NEGATIVE  NEGATIVE mg/dL   Urobilinogen, UA 0.2  0.0 - 1.0 mg/dL   Nitrite NEGATIVE  NEGATIVE   Leukocytes, UA NEGATIVE  NEGATIVE  Comment: MICROSCOPIC NOT DONE ON URINES WITH NEGATIVE PROTEIN, BLOOD, LEUKOCYTES, NITRITE, OR GLUCOSE <1000 mg/dL.  TROPONIN I     Status: None   Collection Time    03/04/13  8:04 PM      Result Value Ref Range   Troponin I <0.30  <0.30 ng/mL   Comment:            Due to the release kinetics of cTnI,     a negative result within the first hours     of the onset of symptoms does not rule out     myocardial infarction with certainty.     If myocardial infarction is still suspected,     repeat the test at appropriate intervals.  CBC     Status: Abnormal   Collection Time    03/05/13  6:07 AM      Result Value Ref Range   WBC 12.3 (*) 4.0 - 10.5 K/uL   RBC 4.13  3.87 - 5.11 MIL/uL   Hemoglobin 12.4  12.0 - 15.0 g/dL   HCT 38.2  36.0 -  46.0 %   MCV 92.5  78.0 - 100.0 fL   MCH 30.0  26.0 - 34.0 pg   MCHC 32.5  30.0 - 36.0 g/dL   RDW 16.1 (*) 11.5 - 15.5 %   Platelets 365  150 - 400 K/uL  BASIC METABOLIC PANEL     Status: Abnormal   Collection Time    03/05/13  6:07 AM      Result Value Ref Range   Sodium 142  137 - 147 mEq/L   Potassium 3.4 (*) 3.7 - 5.3 mEq/L   Chloride 105  96 - 112 mEq/L   CO2 23  19 - 32 mEq/L   Glucose, Bld 128 (*) 70 - 99 mg/dL   BUN 9  6 - 23 mg/dL   Creatinine, Ser 0.54  0.50 - 1.10 mg/dL   Calcium 9.1  8.4 - 10.5 mg/dL   GFR calc non Af Amer 87 (*) >90 mL/min   GFR calc Af Amer >90  >90 mL/min   Comment: (NOTE)     The eGFR has been calculated using the CKD EPI equation.     This calculation has not been validated in all clinical situations.     eGFR's persistently <90 mL/min signify possible Chronic Kidney     Disease.    Dg Chest 2 View  03/04/2013   CLINICAL DATA:  Nausea, vomiting and tachypnea. History of hypertension and thrombocytopenia.  EXAM: CHEST  2 VIEW  COMPARISON:  CT ABD - PELV W/ CM dated 03/04/2013; DG CHEST 2 VIEW dated 05/04/2004  FINDINGS: Subpulmonic right pleural effusion and adjacent right lower lobe atelectasis are unchanged from the earlier CT. The left lung is clear. The heart size and mediastinal contours are stable without evidence of pericardial effusion. There is no pneumothorax.  IMPRESSION: Stable subpulmonic right pleural effusion and right basilar atelectasis.   Electronically Signed   By: Camie Patience M.D.   On: 03/04/2013 21:16   Ct Abdomen Pelvis W Contrast  03/04/2013   CLINICAL DATA:  Nausea and generalized weakness with left lower quadrant pain and vomiting  EXAM: CT ABDOMEN AND PELVIS WITH CONTRAST  TECHNIQUE: Multidetector CT imaging of the abdomen and pelvis was performed using the standard protocol following bolus administration of intravenous contrast.  CONTRAST:  172m OMNIPAQUE IOHEXOL 300 MG/ML SOLN intravenously. The patient did not receive oral  contrast material.  COMPARISON:  CT ABD/PELV WO/W dated 01/22/2013  FINDINGS: The subcapsular hemorrhage within the liver is better defined today. Its maximal AP dimension is approximately 11.9 cm. Its maximal transverse dimension is approximately 9.8 cm. Its maximal longitudinal dimension is approximately 12.4 cm. There is persistent low density in addition to the subcapsular hemorrhage that lies along the dome of the liver and extends along the right lateral surface of the liver. There is no intrahepatic ductal dilation. The liver enhances reasonably well and exhibits no suspicious mass.  The gallbladder is mildly distended and contains a large rim calcified stone which measures approximately 2.4 x 1.6 cm. There is no pericholecystic fluid. The pancreas, spleen, nondistended stomach, adrenal glands, and kidneys are normal in appearance. The caliber of the abdominal aorta is normal. The opacified loops of small and large bowel exhibit no evidence of ileus or of obstruction. There are diverticula throughout the colon, but there are no findings to suggest acute diverticulitis. The urinary bladder and surrounding pelvic soft tissues appear normal. The uterus is surgically absent. There are no adnexal masses. There is no inguinal or umbilical hernia. The psoas musculature is normal in appearance.  There is a small right pleural effusion which has increased in size since the previous study. There is minimal atelectasis adjacent to the pleural effusion. There is no left pleural effusion. The lumbar vertebral bodies are preserved in height. The bony pelvis exhibits no acute abnormality. There are mild degenerative changes of the SI joints.  IMPRESSION: 1. The large subcapsular hemorrhage within the liver has become better defined and is slightly smaller today. There is no evidence of acute hemorrhage. No hepatic mass is demonstrated. 2. There is a large gallstone present without sonographic evidence of acute cholecystitis.  3. The right-sided pleural effusion is increased in size. There is basilar atelectasis on the right. 4. There are scattered colonic diverticula but there is no evidence of acute diverticulitis.   Electronically Signed   By: [name]  Martinique   On: 03/04/2013 20:12   Mr Liver W Wo Contrast  03/05/2013   CLINICAL DATA:  Right-sided abdominal pain with nausea and vomiting. Hepatic subcapsular hematoma.  EXAM: MRI ABDOMEN WITHOUT AND WITH CONTRAST  TECHNIQUE: Multiplanar, multisequence MR imaging was performed both before and after administration of intravenous contrast.  CONTRAST:  56m MULTIHANCE GADOBENATE DIMEGLUMINE 529 MG/ML IV SOLN  COMPARISON:  CT ABD - PELV W/ CM dated 03/04/2013; CT ABD/PELV WO/W dated 01/22/2013; [place] dated 01/21/2013  FINDINGS: Examination is mildly motion degraded, especially on the post-contrast images. Again demonstrated is a large complex subcapsular hepatic hematoma. The largest component is peripheral to the inferior aspect of the right hepatic lobe and deforms it. This component measures 10.7 x 8.3 cm transverse and 12.9 cm cephalocaudad. There are smaller components extending superior laterally over the dome of the right hepatic lobe. This hematoma is multi-septated with T2 hyperintensity and intrinsic T1 shortening. The postcontrast images demonstrate no abnormal enhancement (best seen on the subtracted images). No underlying intraparenchymal lesion is seen.  A large gallstone is again noted. There is no gallbladder wall thickening or biliary dilatation. The pancreas appears normal. There is a duodenal diverticulum. The spleen, adrenal glands and kidneys demonstrate no significant findings. There is a tiny cyst in the interpolar region of the left kidney. There is no hydronephrosis.  Small right-greater-than-left pleural effusions and right lower lobe atelectasis are stable. There is minimal ascites. No other focal intra-abdominal fluid collections are demonstrated.   IMPRESSION: 1. Large subcapsular  hepatic hematoma is unchanged from the CT performed yesterday. This deforms the right hepatic lobe inferiorly. 2. No evidence of underlying mass lesion. 3. Stable right-greater-than-left pleural effusions and right lower lobe atelectasis. 4. Cholelithiasis.   Electronically Signed   By: Camie Patience M.D.   On: 03/05/2013 10:30   Nm Hepato W/eject Fract  03/05/2013   CLINICAL DATA:  79 year old female with abdominal pain. Known right hepatic lobe large subcapsular hematoma, without underlying mass lesion evident by abdomen MRI. Initial encounter.  EXAM: NUCLEAR MEDICINE HEPATOBILIARY IMAGING WITH GALLBLADDER EF  TECHNIQUE: Sequential images of the abdomen were obtained out to 60 minutes following intravenous administration of radiopharmaceutical. After slow intravenous infusion of 1.93 micrograms Cholecystokinin, gallbladder ejection fraction was determined.  COMPARISON:  Abdomen ultrasound and MRI from the same day reported separately.  RADIOPHARMACEUTICALS:  5.0 mCiTc-89mCholetec  FINDINGS: Prompt radiotracer uptake by the liver and clearance of the blood pool.  Prominent oval shaped photopenic defect along the right hepatic lobe consistent with the subcapsular mass described on the comparisons today.  Prompt appearance of the central biliary tree and gallbladder, both visible by 10-15 min. Photopenic defect within the gallbladder consistent with the known cholelithiasis.  Small bowel activity by 30 min.  Gallbladder ejection fraction determine following CCK infusion to be 85.2% over 30 min. Normal ejection fraction is greater than 30%.  The patient did not experience symptoms during CCK infusion.  IMPRESSION: 1. Cholelithiasis but normal gallbladder ejection fraction. No biliary obstruction. 2. Right hepatic subcapsular liver lesion resulting and photopenic defect along the right liver contour. See MRI and ultrasound from today reported separately.   Electronically Signed   By:  LLars PinksM.D.   On: 03/05/2013 13:33   [place] Limited  03/05/2013   CLINICAL DATA:  Rule out cholecystitis.  EXAM: [place] - RIGHT UPPER QUADRANT  COMPARISON:  01/21/2013 as well as CT 03/04/2013 and 01/22/2013  FINDINGS: Gallbladder:  4 cm gallstone present. No wall thickening and negative sonographic Murphy's sign.  Common bile duct:  Diameter: 3.5 mm.  Liver:  Large multi-septated complex cystic mass over the right lobe measuring approximately 9 x 12.6 x 15.7 cm with adjacent subcapsular complex cystic mass measuring 2.5 x 2.9 x 5.3 cm.  IMPRESSION: 4 cm gallstone. No additional sonographic evidence to suggest cholecystitis.  Two complex cystic liver masses in subcapsular location as seen on recent CTs suggesting improving subcapsular hemorrhagic masses/hematomas. Patient is undergoing MRI today for further evaluation of this process.   Electronically Signed   By: DMarin OlpM.D.   On: 03/05/2013 11:00    ROS: see chart Blood pressure 190/77, pulse 80, temperature 98.3 F (36.8 C), temperature source Oral, resp. rate 20, height 5' 5"  (1.651 m), weight 96 kg (211 lb 10.3 oz), SpO2 93.00%. Physical Exam: Pleasant bf in mild discomfort. Abdomen:  Soft, tender along right subcostal margin/right flank region.  No rigidity noted.  Assessment/Plan: Imp:  Large subcapsular liver hematoma of unknown etiology, cholelithiasis. Plan:  Symptoms of abdominal pain and nausea secondary to capsular stretch.  Cholecystectomy not recommended until hematoma resolved.  Would not use lovenox (have stopped this).  This may take multiple months to resolve.  Would supplement with zofran and pain meds.  Trinette Vera A 03/06/2013, 12:54 PM

## 2013-03-06 NOTE — Consult Note (Signed)
Reason for Consult:vomiting Referring Physician: NHospitalist  Whitney [name] is an 80 y.o. female.  HPI: Admitted thru the ED Monday with c/o of vomiting. She vomited 3-4 times. The vomiting started the morning of admission. She denies having a fever. She also c/o rt mid abdominal pain. She has not had a BM since Saturday. She denies any recent diarrhea.  No recent antibiotics. Today she says she does not feel good. She has ran a low grade fever. Admitted last month for same symptoms at [facility] with possible gastroenteritis vis cholecystitis (n, v, d). Transaminases were also elevated.  She was evaluated by Dr. Sherlene Shams and to follow up with him on an outpatient basis. 01/21/2013 AST 102, ALT 228, ALP 62, total abili 1.1. She also received 2 units of PRBCs while in the hospital for a hemoglobin of 7.8. Blood cultures were negative.  She says she has not eaten since Sunday. She just did not have an appetite.  She denies past history of a fall.  Fecal occult blood 01/22/2013 negative CT abdomen/pelvis with CM;   . IMPRESSION: 1. The large subcapsular hemorrhage within the liver has become better defined and is slightly smaller today. There is no evidence of acute hemorrhage. No hepatic mass is demonstrated. 2. There is a large gallstone present without sonographic evidence of acute cholecystitis. 3. The right-sided pleural effusion is increased in size. There is basilar atelectasis on the right. 4. There are scattered colonic diverticula but there is no evidence of acute diverticulitis. Electronically Signed By: [name] Martinique On: 03/04/2013 20:12    CMP     Component Value Date/Time   NA 142 03/05/2013 0607   K 3.4* 03/05/2013 0607   CL 105 03/05/2013 0607   CO2 23 03/05/2013 0607   GLUCOSE 128* 03/05/2013 0607   BUN 9 03/05/2013 0607   CREATININE 0.54 03/05/2013 0607   CALCIUM 9.1 03/05/2013 0607   PROT 7.8 03/04/2013 1422   ALBUMIN 3.4* 03/04/2013 1422   AST 15 03/04/2013 1422   ALT 13 03/04/2013 1422    ALKPHOS 79 03/04/2013 1422   BILITOT 0.5 03/04/2013 1422   GFRNONAA 87* 03/05/2013 0607   GFRAA >90 03/05/2013 0607   CBC    Component Value Date/Time   WBC 12.3* 03/05/2013 0607   RBC 4.13 03/05/2013 0607   RBC 2.32* 01/21/2013 1421   HGB 12.4 03/05/2013 0607   HCT 38.2 03/05/2013 0607   PLT 365 03/05/2013 0607   MCV 92.5 03/05/2013 0607   MCH 30.0 03/05/2013 0607   MCHC 32.5 03/05/2013 0607   RDW 16.1* 03/05/2013 0607   LYMPHSABS 1.1 03/04/2013 1422   MONOABS 0.3 03/04/2013 1422   EOSABS 0.0 03/04/2013 1422   BASOSABS 0.0 03/04/2013 1422        Past Medical History  Diagnosis Date  . Hyperlipidemia   . Hypertension   . Thrombocytopenia   . Lactic acidosis   . Abnormal EKG   . Transaminitis     Past Surgical History  Procedure Laterality Date  . Cardiovascular stress test  04/03/2012    Normal study. No evidence of ischemia.  . Abdominal hysterectomy      Family History  Problem Relation Age of Onset  . Diabetes Child     Social History:  reports that she has never smoked. She has never used smokeless tobacco. She reports that she does not drink alcohol or use illicit drugs.  Allergies: No Known Allergies  Medications: I have reviewed the patient's current medications.  Results for orders placed during the hospital encounter of 03/04/13 (from the past 48 hour(s))  CBC WITH DIFFERENTIAL     Status: Abnormal   Collection Time    03/04/13  2:22 PM      Result Value Ref Range   WBC 9.3  4.0 - 10.5 K/uL   RBC 4.16  3.87 - 5.11 MIL/uL   Hemoglobin 12.6  12.0 - 15.0 g/dL   HCT 38.9  36.0 - 46.0 %   MCV 93.5  78.0 - 100.0 fL   MCH 30.3  26.0 - 34.0 pg   MCHC 32.4  30.0 - 36.0 g/dL   RDW 16.0 (*) 11.5 - 15.5 %   Platelets 369  150 - 400 K/uL   Neutrophils Relative % 85 (*) 43 - 77 %   Neutro Abs 7.9 (*) 1.7 - 7.7 K/uL   Lymphocytes Relative 11 (*) 12 - 46 %   Lymphs Abs 1.1  0.7 - 4.0 K/uL   Monocytes Relative 3  3 - 12 %   Monocytes Absolute 0.3  0.1 - 1.0 K/uL   Eosinophils  Relative 0  0 - 5 %   Eosinophils Absolute 0.0  0.0 - 0.7 K/uL   Basophils Relative 0  0 - 1 %   Basophils Absolute 0.0  0.0 - 0.1 K/uL  COMPREHENSIVE METABOLIC PANEL     Status: Abnormal   Collection Time    03/04/13  2:22 PM      Result Value Ref Range   Sodium 139  137 - 147 mEq/L   Potassium 3.6 (*) 3.7 - 5.3 mEq/L   Chloride 99  96 - 112 mEq/L   CO2 25  19 - 32 mEq/L   Glucose, Bld 170 (*) 70 - 99 mg/dL   BUN 15  6 - 23 mg/dL   Creatinine, Ser 0.61  0.50 - 1.10 mg/dL   Calcium 9.5  8.4 - 10.5 mg/dL   Total Protein 7.8  6.0 - 8.3 g/dL   Albumin 3.4 (*) 3.5 - 5.2 g/dL   AST 15  0 - 37 U/L   ALT 13  0 - 35 U/L   Alkaline Phosphatase 79  39 - 117 U/L   Total Bilirubin 0.5  0.3 - 1.2 mg/dL   GFR calc non Af Amer 84 (*) >90 mL/min   GFR calc Af Amer >90  >90 mL/min   Comment: (NOTE)     The eGFR has been calculated using the CKD EPI equation.     This calculation has not been validated in all clinical situations.     eGFR's persistently <90 mL/min signify possible Chronic Kidney     Disease.  SAMPLE TO BLOOD BANK     Status: None   Collection Time    03/04/13  2:40 PM      Result Value Ref Range   Blood Bank Specimen SAMPLE AVAILABLE FOR TESTING     Sample Expiration 03/05/2013    TROPONIN I     Status: None   Collection Time    03/04/13  2:40 PM      Result Value Ref Range   Troponin I <0.30  <0.30 ng/mL   Comment:            Due to the release kinetics of cTnI,     a negative result within the first hours     of the onset of symptoms does not rule out     myocardial infarction with certainty.  If myocardial infarction is still suspected,     repeat the test at appropriate intervals.  LIPASE, BLOOD     Status: None   Collection Time    03/04/13  2:40 PM      Result Value Ref Range   Lipase 15  11 - 59 U/L  PROTIME-INR     Status: None   Collection Time    03/04/13  2:40 PM      Result Value Ref Range   Prothrombin Time 13.7  11.6 - 15.2 seconds   INR 1.07   0.00 - 1.49  BLOOD GAS, VENOUS     Status: Abnormal   Collection Time    03/04/13  3:07 PM      Result Value Ref Range   pH, Ven 7.443 (*) 7.250 - 7.300   pCO2, Ven 33.6 (*) 45.0 - 50.0 mmHg   pO2, Ven 61.6 (*) 30.0 - 45.0 mmHg   Bicarbonate 22.6  20.0 - 24.0 mEq/L   TCO2 19.8  0 - 100 mmol/L   Acid-base deficit 0.4  0.0 - 2.0 mmol/L   O2 Saturation 91.4     Drawn by [id number]     Sample type VENOUS    LACTIC ACID, PLASMA     Status: Abnormal   Collection Time    03/04/13  3:13 PM      Result Value Ref Range   Lactic Acid, Venous 2.4 (*) 0.5 - 2.2 mmol/L  URINALYSIS, ROUTINE W REFLEX MICROSCOPIC     Status: Abnormal   Collection Time    03/04/13  4:50 PM      Result Value Ref Range   Color, Urine YELLOW  YELLOW   APPearance CLEAR  CLEAR   Specific Gravity, Urine 1.020  1.005 - 1.030   pH 6.5  5.0 - 8.0   Glucose, UA NEGATIVE  NEGATIVE mg/dL   Hgb urine dipstick NEGATIVE  NEGATIVE   Bilirubin Urine NEGATIVE  NEGATIVE   Ketones, ur 15 (*) NEGATIVE mg/dL   Protein, ur NEGATIVE  NEGATIVE mg/dL   Urobilinogen, UA 0.2  0.0 - 1.0 mg/dL   Nitrite NEGATIVE  NEGATIVE   Leukocytes, UA NEGATIVE  NEGATIVE   Comment: MICROSCOPIC NOT DONE ON URINES WITH NEGATIVE PROTEIN, BLOOD, LEUKOCYTES, NITRITE, OR GLUCOSE <1000 mg/dL.  TROPONIN I     Status: None   Collection Time    03/04/13  8:04 PM      Result Value Ref Range   Troponin I <0.30  <0.30 ng/mL   Comment:            Due to the release kinetics of cTnI,     a negative result within the first hours     of the onset of symptoms does not rule out     myocardial infarction with certainty.     If myocardial infarction is still suspected,     repeat the test at appropriate intervals.  CBC     Status: Abnormal   Collection Time    03/05/13  6:07 AM      Result Value Ref Range   WBC 12.3 (*) 4.0 - 10.5 K/uL   RBC 4.13  3.87 - 5.11 MIL/uL   Hemoglobin 12.4  12.0 - 15.0 g/dL   HCT 38.2  36.0 - 46.0 %   MCV 92.5  78.0 - 100.0 fL   MCH  30.0  26.0 - 34.0 pg   MCHC 32.5  30.0 - 36.0 g/dL   RDW 16.1 (*)  11.5 - 15.5 %   Platelets 365  150 - 400 K/uL  BASIC METABOLIC PANEL     Status: Abnormal   Collection Time    03/05/13  6:07 AM      Result Value Ref Range   Sodium 142  137 - 147 mEq/L   Potassium 3.4 (*) 3.7 - 5.3 mEq/L   Chloride 105  96 - 112 mEq/L   CO2 23  19 - 32 mEq/L   Glucose, Bld 128 (*) 70 - 99 mg/dL   BUN 9  6 - 23 mg/dL   Creatinine, Ser 0.54  0.50 - 1.10 mg/dL   Calcium 9.1  8.4 - 10.5 mg/dL   GFR calc non Af Amer 87 (*) >90 mL/min   GFR calc Af Amer >90  >90 mL/min   Comment: (NOTE)     The eGFR has been calculated using the CKD EPI equation.     This calculation has not been validated in all clinical situations.     eGFR's persistently <90 mL/min signify possible Chronic Kidney     Disease.    Dg Chest 2 View  03/04/2013   CLINICAL DATA:  Nausea, vomiting and tachypnea. History of hypertension and thrombocytopenia.  EXAM: CHEST  2 VIEW  COMPARISON:  CT ABD - PELV W/ CM dated 03/04/2013; DG CHEST 2 VIEW dated 05/04/2004  FINDINGS: Subpulmonic right pleural effusion and adjacent right lower lobe atelectasis are unchanged from the earlier CT. The left lung is clear. The heart size and mediastinal contours are stable without evidence of pericardial effusion. There is no pneumothorax.  IMPRESSION: Stable subpulmonic right pleural effusion and right basilar atelectasis.   Electronically Signed   By: Camie Patience M.D.   On: 03/04/2013 21:16   Ct Abdomen Pelvis W Contrast  03/04/2013   CLINICAL DATA:  Nausea and generalized weakness with left lower quadrant pain and vomiting  EXAM: CT ABDOMEN AND PELVIS WITH CONTRAST  TECHNIQUE: Multidetector CT imaging of the abdomen and pelvis was performed using the standard protocol following bolus administration of intravenous contrast.  CONTRAST:  124m OMNIPAQUE IOHEXOL 300 MG/ML SOLN intravenously. The patient did not receive oral contrast material.  COMPARISON:  CT ABD/PELV  WO/W dated 01/22/2013  FINDINGS: The subcapsular hemorrhage within the liver is better defined today. Its maximal AP dimension is approximately 11.9 cm. Its maximal transverse dimension is approximately 9.8 cm. Its maximal longitudinal dimension is approximately 12.4 cm. There is persistent low density in addition to the subcapsular hemorrhage that lies along the dome of the liver and extends along the right lateral surface of the liver. There is no intrahepatic ductal dilation. The liver enhances reasonably well and exhibits no suspicious mass.  The gallbladder is mildly distended and contains a large rim calcified stone which measures approximately 2.4 x 1.6 cm. There is no pericholecystic fluid. The pancreas, spleen, nondistended stomach, adrenal glands, and kidneys are normal in appearance. The caliber of the abdominal aorta is normal. The opacified loops of small and large bowel exhibit no evidence of ileus or of obstruction. There are diverticula throughout the colon, but there are no findings to suggest acute diverticulitis. The urinary bladder and surrounding pelvic soft tissues appear normal. The uterus is surgically absent. There are no adnexal masses. There is no inguinal or umbilical hernia. The psoas musculature is normal in appearance.  There is a small right pleural effusion which has increased in size since the previous study. There is minimal atelectasis adjacent to the pleural effusion.  There is no left pleural effusion. The lumbar vertebral bodies are preserved in height. The bony pelvis exhibits no acute abnormality. There are mild degenerative changes of the SI joints.  IMPRESSION: 1. The large subcapsular hemorrhage within the liver has become better defined and is slightly smaller today. There is no evidence of acute hemorrhage. No hepatic mass is demonstrated. 2. There is a large gallstone present without sonographic evidence of acute cholecystitis. 3. The right-sided pleural effusion is  increased in size. There is basilar atelectasis on the right. 4. There are scattered colonic diverticula but there is no evidence of acute diverticulitis.   Electronically Signed   By: [name]  Martinique   On: 03/04/2013 20:12   Mr Liver W Wo Contrast  03/05/2013   CLINICAL DATA:  Right-sided abdominal pain with nausea and vomiting. Hepatic subcapsular hematoma.  EXAM: MRI ABDOMEN WITHOUT AND WITH CONTRAST  TECHNIQUE: Multiplanar, multisequence MR imaging was performed both before and after administration of intravenous contrast.  CONTRAST:  77m MULTIHANCE GADOBENATE DIMEGLUMINE 529 MG/ML IV SOLN  COMPARISON:  CT ABD - PELV W/ CM dated 03/04/2013; CT ABD/PELV WO/W dated 01/22/2013; [place] COMPLETE dated 01/21/2013  FINDINGS: Examination is mildly motion degraded, especially on the post-contrast images. Again demonstrated is a large complex subcapsular hepatic hematoma. The largest component is peripheral to the inferior aspect of the right hepatic lobe and deforms it. This component measures 10.7 x 8.3 cm transverse and 12.9 cm cephalocaudad. There are smaller components extending superior laterally over the dome of the right hepatic lobe. This hematoma is multi-septated with T2 hyperintensity and intrinsic T1 shortening. The postcontrast images demonstrate no abnormal enhancement (best seen on the subtracted images). No underlying intraparenchymal lesion is seen.  A large gallstone is again noted. There is no gallbladder wall thickening or biliary dilatation. The pancreas appears normal. There is a duodenal diverticulum. The spleen, adrenal glands and kidneys demonstrate no significant findings. There is a tiny cyst in the interpolar region of the left kidney. There is no hydronephrosis.  Small right-greater-than-left pleural effusions and right lower lobe atelectasis are stable. There is minimal ascites. No other focal intra-abdominal fluid collections are demonstrated.  IMPRESSION: 1. Large subcapsular hepatic  hematoma is unchanged from the CT performed yesterday. This deforms the right hepatic lobe inferiorly. 2. No evidence of underlying mass lesion. 3. Stable right-greater-than-left pleural effusions and right lower lobe atelectasis. 4. Cholelithiasis.   Electronically Signed   By: BCamie PatienceM.D.   On: 03/05/2013 10:30   Nm Hepato W/eject Fract  03/05/2013   CLINICAL DATA:  80year old female with abdominal pain. Known right hepatic lobe large subcapsular hematoma, without underlying mass lesion evident by abdomen MRI. Initial encounter.  EXAM: NUCLEAR MEDICINE HEPATOBILIARY IMAGING WITH GALLBLADDER EF  TECHNIQUE: Sequential images of the abdomen were obtained out to 60 minutes following intravenous administration of radiopharmaceutical. After slow intravenous infusion of 1.93 micrograms Cholecystokinin, gallbladder ejection fraction was determined.  COMPARISON:  Abdomen ultrasound and MRI from the same day reported separately.  RADIOPHARMACEUTICALS:  5.0 mCiTc-917mholetec  FINDINGS: Prompt radiotracer uptake by the liver and clearance of the blood pool.  Prominent oval shaped photopenic defect along the right hepatic lobe consistent with the subcapsular mass described on the comparisons today.  Prompt appearance of the central biliary tree and gallbladder, both visible by 10-15 min. Photopenic defect within the gallbladder consistent with the known cholelithiasis.  Small bowel activity by 30 min.  Gallbladder ejection fraction determine following CCK infusion to be 85.2%  over 30 min. Normal ejection fraction is greater than 30%.  The patient did not experience symptoms during CCK infusion.  IMPRESSION: 1. Cholelithiasis but normal gallbladder ejection fraction. No biliary obstruction. 2. Right hepatic subcapsular liver lesion resulting and photopenic defect along the right liver contour. See MRI and ultrasound from today reported separately.   Electronically Signed   By: Lars Pinks M.D.   On: 03/05/2013 13:33    US Abdomen Limited  03/05/2013   CLINICAL DATA:  Rule out cholecystitis.  EXAM: US ABDOMEN LIMITED - RIGHT UPPER QUADRANT  COMPARISON:  01/21/2013 as well as CT 03/04/2013 and 01/22/2013  FINDINGS: Gallbladder:  4 cm gallstone present. No wall thickening and negative sonographic Murphy's sign.  Common bile duct:  Diameter: 3.5 mm.  Liver:  Large multi-septated complex cystic mass over the right lobe measuring approximately 9 x 12.6 x 15.7 cm with adjacent subcapsular complex cystic mass measuring 2.5 x 2.9 x 5.3 cm.  IMPRESSION: 4 cm gallstone. No additional sonographic evidence to suggest cholecystitis.  Two complex cystic liver masses in subcapsular location as seen on recent CTs suggesting improving subcapsular hemorrhagic masses/hematomas. Patient is undergoing MRI today for further evaluation of this process.   Electronically Signed   By: Marin Olp M.D.   On: 03/05/2013 11:00    ROS Blood pressure 190/77, pulse 80, temperature 98.3 F (36.8 C), temperature source Oral, resp. rate 20, height 5' 5"  (1.651 m), weight 211 lb 10.3 oz (96 kg), SpO2 93.00%. Physical Exam Alert and oriented. Skin warm and dry. Oral mucosa is moist.   . Sclera anicteric, conjunctivae is pink. Thyroid not enlarged. No cervical lymphadenopathy. Lungs clear. Heart regular rate and rhythm.  Abdomen is soft. Bowel sounds are positive.  . No abdominal masses felt. Slight tenderness rt mid abdomen  No edema to lower extremities. Patient is alert and oriented.  Assessment/Plan: Nausea. Recent admission to [facility] for gastroenteritis vs cholecystitis. Evaluated by Dr. Sherlene Shams.  Will discuss with Dr. Laural Golden.  SETZER,TERRI W 03/06/2013, 11:44 AM    GI attending note; Patient interviewed and examined. CT from 01/22/2013 reviewed along with abdominopelvic CT from 03-2013 and MRI of liver from yesterday. Patient's exam is pertinent for tender hepatomegaly. Suspect nausea vomiting and RUQ abdominal pain secondary to large  subcapsular hepatic hematoma. She also has large calcified gallstone which appeared to be incidental finding at this time. What is unusual about patient's illness is that symptoms have relapsed after she presented patient 6 weeks ago. Second of worrisome aspect of her illnesses is the cause of this hematoma. She certainly could have a small lesion have been in this large hematoma but none seen on CT and MRI. Hematoma appears to be getting smaller although still quite large. Patient does not take NSAIDs and there is no history of peptic ulcer disease. Therefore peptic ulcer disease unlikely. May consider diagnostic EGD if symptoms present. Will start patient on pantoprazole 40 mg IV every 12 hours. Patient will be reevaluated in a.m.

## 2013-03-06 NOTE — Progress Notes (Signed)
Whitney [name]:[id number] DOB: 09/14/1943 DOA: 03/04/2013 PCP: Milana Obey, MD   Subjective: This lady was admitted  with nausea and vomiting. She continues to have this on and off for the last month or so and also has had right sided abdominal discomfort/pain. She was found to have a liver subcapsular hemorrhage, which has now become better defined and slightly smaller than previously documented.  She continues to have abdominal discomfort with nausea but she is somewhat tolerating a clear fluid diet. MRI of the liver and is not suggestive of a malignant mass.           Physical Exam: Blood pressure 190/77, pulse 80, temperature 98.3 F (36.8 C), temperature source Oral, resp. rate 20, height 5\' 5"  (1.651 m), weight 96 kg (211 lb 10.3 oz), SpO2 93.00%. She looks systemically well. She does not appear to be in acute pain. She is nontoxic or septic. Heart sounds are present without murmurs. Lung fields are clear. Abdomen is soft but is tender over the right upper quadrant .She is alert and oriented.   Investigations:     Basic Metabolic Panel:  Recent Labs  81/19/14 1422 03/05/13 0607  NA 139 142  K 3.6* 3.4*  CL 99 105  CO2 25 23  GLUCOSE 170* 128*  BUN 15 9  CREATININE 0.61 0.54  CALCIUM 9.5 9.1   Liver Function Tests:  Recent Labs  03/04/13 1422  AST 15  ALT 13  ALKPHOS 79  BILITOT 0.5  PROT 7.8  ALBUMIN 3.4*     CBC:  Recent Labs  03/04/13 1422 03/05/13 0607  WBC 9.3 12.3*  NEUTROABS 7.9*  --   HGB 12.6 12.4  HCT 38.9 38.2  MCV 93.5 92.5  PLT 369 365    Dg Chest 2 View  03/04/2013   CLINICAL DATA:  Nausea, vomiting and tachypnea. History of hypertension and thrombocytopenia.  EXAM: CHEST  2 VIEW  COMPARISON:  CT ABD - PELV W/ CM dated 03/04/2013; DG CHEST 2 VIEW dated 05/04/2004  FINDINGS: Subpulmonic right pleural effusion and adjacent right lower lobe atelectasis are unchanged from the earlier CT. The left lung is clear. The  heart size and mediastinal contours are stable without evidence of pericardial effusion. There is no pneumothorax.  IMPRESSION: Stable subpulmonic right pleural effusion and right basilar atelectasis.   Electronically Signed   By: Roxy Horseman M.D.   On: 03/04/2013 21:16   Ct Abdomen Pelvis W Contrast  03/04/2013   CLINICAL DATA:  Nausea and generalized weakness with left lower quadrant pain and vomiting  EXAM: CT ABDOMEN AND PELVIS WITH CONTRAST  TECHNIQUE: Multidetector CT imaging of the abdomen and pelvis was performed using the standard protocol following bolus administration of intravenous contrast.  CONTRAST:  OMNIPAQUE IOHEXOL 300 MG/ML SOLN intravenously. The patient did not receive oral contrast material.  COMPARISON:  CT ABD/PELV WO/W dated 01/22/2013  FINDINGS: The subcapsular hemorrhage within the liver is better defined today. Its maximal AP dimension is approximately 11.9 cm. Its maximal transverse dimension is approximately 9.8 cm. Its maximal longitudinal dimension is approximately 12.4 cm. There is persistent low density in addition to the subcapsular hemorrhage that lies along the dome of the liver and extends along the right lateral surface of the liver. There is no intrahepatic ductal dilation. The liver enhances reasonably well and exhibits no suspicious mass.  The gallbladder is mildly distended and contains a large rim calcified stone which measures approximately 2.4 x 1.6 cm. There is no  pericholecystic fluid. The pancreas, spleen, nondistended stomach, adrenal glands, and kidneys are normal in appearance. The caliber of the abdominal aorta is normal. The opacified loops of small and large bowel exhibit no evidence of ileus or of obstruction. There are diverticula throughout the colon, but there are no findings to suggest acute diverticulitis. The urinary bladder and surrounding pelvic soft tissues appear normal. The uterus is surgically absent. There are no adnexal masses. There is no  inguinal or umbilical hernia. The psoas musculature is normal in appearance.  There is a small right pleural effusion which has increased in size since the previous study. There is minimal atelectasis adjacent to the pleural effusion. There is no left pleural effusion. The lumbar vertebral bodies are preserved in height. The bony pelvis exhibits no acute abnormality. There are mild degenerative changes of the SI joints.  IMPRESSION: 1. The large subcapsular hemorrhage within the liver has become better defined and is slightly smaller today. There is no evidence of acute hemorrhage. No hepatic mass is demonstrated. 2. There is a large gallstone present without sonographic evidence of acute cholecystitis. 3. The right-sided pleural effusion is increased in size. There is basilar atelectasis on the right. 4. There are scattered colonic diverticula but there is no evidence of acute diverticulitis.   Electronically Signed   By: [name]  [place]   On: 03/04/2013 20:12   Mr Liver W Wo Contrast  03/05/2013   CLINICAL DATA:  Right-sided abdominal pain with nausea and vomiting. Hepatic subcapsular hematoma.  EXAM: MRI ABDOMEN WITHOUT AND WITH CONTRAST  TECHNIQUE: Multiplanar, multisequence MR imaging was performed both before and after administration of intravenous contrast.  CONTRAST:  20mL MULTIHANCE GADOBENATE DIMEGLUMINE 529 MG/ML IV SOLN  COMPARISON:  CT ABD - PELV W/ CM dated 03/04/2013; CT ABD/PELV WO/W dated 01/22/2013; US ABDOMEN COMPLETE dated 01/21/2013  FINDINGS: Examination is mildly motion degraded, especially on the post-contrast images. Again demonstrated is a large complex subcapsular hepatic hematoma. The largest component is peripheral to the inferior aspect of the right hepatic lobe and deforms it. This component measures 10.7 x 8.3 cm transverse and 12.9 cm cephalocaudad. There are smaller components extending superior laterally over the dome of the right hepatic lobe. This hematoma is multi-septated with T2  hyperintensity and intrinsic T1 shortening. The postcontrast images demonstrate no abnormal enhancement (best seen on the subtracted images). No underlying intraparenchymal lesion is seen.  A large gallstone is again noted. There is no gallbladder wall thickening or biliary dilatation. The pancreas appears normal. There is a duodenal diverticulum. The spleen, adrenal glands and kidneys demonstrate no significant findings. There is a tiny cyst in the interpolar region of the left kidney. There is no hydronephrosis.  Small right-greater-than-left pleural effusions and right lower lobe atelectasis are stable. There is minimal ascites. No other focal intra-abdominal fluid collections are demonstrated.  IMPRESSION: 1. Large subcapsular hepatic hematoma is unchanged from the CT performed yesterday. This deforms the right hepatic lobe inferiorly. 2. No evidence of underlying mass lesion. 3. Stable right-greater-than-left pleural effusions and right lower lobe atelectasis. 4. Cholelithiasis.   Electronically Signed   By: Roxy HorsemanBill  Veazey M.D.   On: 03/05/2013 10:30   Nm Hepato W/eject Fract  03/05/2013   CLINICAL DATA:  80 year old female with abdominal pain. Known right hepatic lobe large subcapsular hematoma, without underlying mass lesion evident by abdomen MRI. Initial encounter.  EXAM: NUCLEAR MEDICINE HEPATOBILIARY IMAGING WITH GALLBLADDER EF  TECHNIQUE: Sequential images of the abdomen were obtained out to 60 minutes following  intravenous administration of radiopharmaceutical. After slow intravenous infusion of 1.93 micrograms Cholecystokinin, gallbladder ejection fraction was determined.  COMPARISON:  Abdomen ultrasound and MRI from the same day reported separately.  RADIOPHARMACEUTICALS:  5.0 mCiTc-35m Choletec  FINDINGS: Prompt radiotracer uptake by the liver and clearance of the blood pool.  Prominent oval shaped photopenic defect along the right hepatic lobe consistent with the subcapsular mass described on the  comparisons today.  Prompt appearance of the central biliary tree and gallbladder, both visible by 10-15 min. Photopenic defect within the gallbladder consistent with the known cholelithiasis.  Small bowel activity by 30 min.  Gallbladder ejection fraction determine following CCK infusion to be 85.2% over 30 min. Normal ejection fraction is greater than 30%.  The patient did not experience symptoms during CCK infusion.  IMPRESSION: 1. Cholelithiasis but normal gallbladder ejection fraction. No biliary obstruction. 2. Right hepatic subcapsular liver lesion resulting and photopenic defect along the right liver contour. See MRI and ultrasound from today reported separately.   Electronically Signed   By: Augusto Gamble M.D.   On: 03/05/2013 13:33   US Abdomen Limited  03/05/2013   CLINICAL DATA:  Rule out cholecystitis.  EXAM: US ABDOMEN LIMITED - RIGHT UPPER QUADRANT  COMPARISON:  01/21/2013 as well as CT 03/04/2013 and 01/22/2013  FINDINGS: Gallbladder:  4 cm gallstone present. No wall thickening and negative sonographic Murphy's sign.  Common bile duct:  Diameter: 3.5 mm.  Liver:  Large multi-septated complex cystic mass over the right lobe measuring approximately 9 x 12.6 x 15.7 cm with adjacent subcapsular complex cystic mass measuring 2.5 x 2.9 x 5.3 cm.  IMPRESSION: 4 cm gallstone. No additional sonographic evidence to suggest cholecystitis.  Two complex cystic liver masses in subcapsular location as seen on recent CTs suggesting improving subcapsular hemorrhagic masses/hematomas. Patient is undergoing MRI today for further evaluation of this process.   Electronically Signed   By: Elberta Fortis M.D.   On: 03/05/2013 11:00      Medications: I have reviewed the patient's current medications.  Impression: 1. Nausea and vomiting, unclear etiology at the present time. 2. Liver mass, likely not malignant. 3. Large gallstone present without evidence of acute cholecystitis. 4. Hypertension,  uncontrolled.     Plan: 1. Surgical consultation. 2. Gastroenterology consultation.   Consultants:  None at the present time.   Procedures:  None.   Antibiotics:  None.                   Code Status: Full code.  Family Communication: Discussed plan with patient at the bedside.   Disposition Plan: Home when medically stable.  Time spent: 15 minutes.   LOS: 2 days   Stanisha Lorenz C   03/06/2013, 8:49 AM

## 2013-03-07 ENCOUNTER — Encounter (HOSPITAL_COMMUNITY): Payer: Self-pay | Admitting: *Deleted

## 2013-03-07 ENCOUNTER — Encounter (HOSPITAL_COMMUNITY)
Admission: EM | Disposition: A | Discharge status: Home / Self Care | Payer: Self-pay | Source: Home / Self Care | Attending: Internal Medicine

## 2013-03-07 DIAGNOSIS — K7689 Other specified diseases of liver: Secondary | ICD-10-CM

## 2013-03-07 DIAGNOSIS — K208 Other esophagitis without bleeding: Secondary | ICD-10-CM

## 2013-03-07 DIAGNOSIS — R1011 Right upper quadrant pain: Secondary | ICD-10-CM

## 2013-03-07 HISTORY — PX: ESOPHAGOGASTRODUODENOSCOPY: SHX5428

## 2013-03-07 LAB — COMPREHENSIVE METABOLIC PANEL
ALK PHOS: 69 U/L (ref 39–117)
ALT: 14 U/L (ref 0–35)
AST: 17 U/L (ref 0–37)
Albumin: 2.9 g/dL — ABNORMAL LOW (ref 3.5–5.2)
BILIRUBIN TOTAL: 0.5 mg/dL (ref 0.3–1.2)
BUN: 11 mg/dL (ref 6–23)
CHLORIDE: 105 meq/L (ref 96–112)
CO2: 23 meq/L (ref 19–32)
Calcium: 8.9 mg/dL (ref 8.4–10.5)
Creatinine, Ser: 0.6 mg/dL (ref 0.50–1.10)
GFR calc non Af Amer: 84 mL/min — ABNORMAL LOW (ref >90)
GLUCOSE: 112 mg/dL — AB (ref 70–99)
POTASSIUM: 4 meq/L (ref 3.7–5.3)
Sodium: 139 mEq/L (ref 137–147)
Total Protein: 6.5 g/dL (ref 6.0–8.3)

## 2013-03-07 LAB — CBC
HEMATOCRIT: 38.5 % (ref 36.0–46.0)
Hemoglobin: 12.3 g/dL (ref 12.0–15.0)
MCH: 29.8 pg (ref 26.0–34.0)
MCHC: 31.9 g/dL (ref 30.0–36.0)
MCV: 93.2 fL (ref 78.0–100.0)
Platelets: 349 10*3/uL (ref 150–400)
RBC: 4.13 MIL/uL (ref 3.87–5.11)
RDW: 16.2 % — ABNORMAL HIGH (ref 11.5–15.5)
WBC: 8.9 10*3/uL (ref 4.0–10.5)

## 2013-03-07 SURGERY — EGD (ESOPHAGOGASTRODUODENOSCOPY)
Anesthesia: Moderate Sedation

## 2013-03-07 MED ORDER — LISINOPRIL 10 MG PO TABS
40.0000 mg | ORAL_TABLET | Freq: Every day | ORAL | Status: DC
  Administered 2013-03-07 – 2013-03-10 (×4): 40 mg via ORAL
  Filled 2013-03-07 (×5): qty 4

## 2013-03-07 MED ORDER — ONDANSETRON HCL 4 MG/2ML IJ SOLN
4.0000 mg | Freq: Four times a day (QID) | INTRAMUSCULAR | Status: DC
  Administered 2013-03-07 – 2013-03-08 (×4): 4 mg via INTRAVENOUS
  Filled 2013-03-07 (×4): qty 2

## 2013-03-07 MED ORDER — MEPERIDINE HCL 100 MG/ML IJ SOLN
INTRAMUSCULAR | Status: DC | PRN
  Administered 2013-03-07: 25 mg via INTRAVENOUS

## 2013-03-07 MED ORDER — METOPROLOL TARTRATE 50 MG PO TABS
100.0000 mg | ORAL_TABLET | Freq: Two times a day (BID) | ORAL | Status: DC
  Administered 2013-03-07 – 2013-03-10 (×7): 100 mg via ORAL
  Filled 2013-03-07 (×7): qty 2

## 2013-03-07 MED ORDER — ONDANSETRON HCL 4 MG/2ML IJ SOLN
INTRAMUSCULAR | Status: AC
  Filled 2013-03-07: qty 2

## 2013-03-07 MED ORDER — MIDAZOLAM HCL 5 MG/5ML IJ SOLN
INTRAMUSCULAR | Status: AC
  Filled 2013-03-07: qty 10

## 2013-03-07 MED ORDER — MIDAZOLAM HCL 5 MG/5ML IJ SOLN
INTRAMUSCULAR | Status: DC | PRN
  Administered 2013-03-07: 2 mg via INTRAVENOUS

## 2013-03-07 MED ORDER — ONDANSETRON HCL 4 MG/2ML IJ SOLN
INTRAMUSCULAR | Status: DC | PRN
  Administered 2013-03-07: 4 mg via INTRAVENOUS

## 2013-03-07 MED ORDER — LIDOCAINE VISCOUS 2 % MT SOLN
OROMUCOSAL | Status: DC | PRN
  Administered 2013-03-07: 1 via OROMUCOSAL

## 2013-03-07 MED ORDER — LIDOCAINE VISCOUS 2 % MT SOLN
OROMUCOSAL | Status: AC
  Filled 2013-03-07: qty 15

## 2013-03-07 MED ORDER — STERILE WATER FOR IRRIGATION IR SOLN
Status: DC | PRN
  Administered 2013-03-07: 17:00:00

## 2013-03-07 MED ORDER — SUCRALFATE 1 GM/10ML PO SUSP
1.0000 g | Freq: Three times a day (TID) | ORAL | Status: DC
  Administered 2013-03-07 – 2013-03-10 (×12): 1 g via ORAL
  Filled 2013-03-07 (×11): qty 10

## 2013-03-07 MED ORDER — MEPERIDINE HCL 100 MG/ML IJ SOLN
INTRAMUSCULAR | Status: AC
  Filled 2013-03-07: qty 2

## 2013-03-07 NOTE — Op Note (Signed)
[facility] [address] [place], [id number]   ENDOSCOPY PROCEDURE REPORT  PATIENT: Whitney [name], Whitney A.  MR#: [id number] BIRTHDATE: 07-30-1931 , 81  yrs. old GENDER: Female ENDOSCOPIST: R.  Roetta SessionsMichael Mizuki Hoel, MD FACP FACG REFERRED BY:  Runell GessJonathan J Berry, M.D.  Gareth MorganSteve Knowlton, M.D. PROCEDURE DATE:  03/07/2013 PROCEDURE:     EGD with gastric biopsy  INDICATIONS:     right upper quadrant abdominal pain/nausea/vomiting  INFORMED CONSENT:   The risks, benefits, limitations, alternatives and imponderables have been discussed.  The potential for biopsy, esophogeal dilation, etc. have also been reviewed.  Questions have been answered.  All parties agreeable.  Please see the history and physical in the medical record for more information.  MEDICATIONS:   Versed 2 mg IV and Demerol 25 mg IV in divided doses. Zofran 4 mg IV. Xylocaine gel 2% orally  DESCRIPTION OF PROCEDURE:   The [id number] ([id number]  endoscope was introduced through the mouth and advanced to the second portion of the duodenum without difficulty or limitations.  The mucosal surfaces were surveyed very carefully during advancement of the scope and upon withdrawal.  Retroflexion view of the proximal stomach and esophagogastric junction was performed.      FINDINGS:   Noncritical Schatzki's ring. Distal esophageal erosions 11.5 cm distal esophageal area of ulceration. No Barrett's esophagus. Stomach empty. 3 cm hiatal hernia. Diffuse mottling and intense patchy gastric erythema. Subjectively, some mild swelling of the antrum through the pyloric channel. There was no frank ulcer or infiltrating process seen. The scope easily traversed the pyloric channel. Examination of bulb and second portion revealed no abnormalities.  THERAPEUTIC / DIAGNOSTIC MANEUVERS PERFORMED:  biopsies abnormal gastric mucosa taken for histologic study   COMPLICATIONS:  None  IMPRESSION:    Erosive and ulcerative reflux  esophagitis. Noncritical Schatzki's ring. Hiatal hernia. Inflamed-appearing gastric mucosa with submucosal edema extending down into the antrum through the pyloric channel.  RECOMMENDATIONS:  Continue twice a day proton pump inhibitor therapy. Add Carafate suspension 1 g 4 times a day. Followup on pathology.  I have discussed my findings and recommendations with the patient's daughter via telephone.    _______________________________ R. Roetta SessionsMichael Gillermo Poch, MD FACP [facility] eSigned:  R. Roetta SessionsMichael Malory Spurr, MD FACP [facility]FACG 03/07/2013 4:57 PM     CC:  PATIENT NAME:  Whitney [name], Whitney A. MR#: [id number]

## 2013-03-07 NOTE — Progress Notes (Signed)
Subjective:  Initially presented in 01/2013 with abdominal pain, N/V/D. She was noted to have 14.3cm subcapsular hematoma along lateral aspect of the right liver, there was concern about possibility of underlying mass. Followup MRI was recommended. Patient states that she did note some improvement in her symptoms but never really returned to baseline. She's continued to have anorexia. However she was not having any abdominal pain or vomiting until several days ago when she presented to the hospital.   She continues to have nausea vomiting at this time. Complains of upper abdominal discomfort. States abdominal pain is worsened since admission. No report of melena or rectal bleeding. Does not recall any type of injury to her liver.  Objective: Vital signs in last 24 hours: Temp:  [98.4 F (36.9 C)-98.7 F (37.1 C)] 98.7 F (37.1 C) (03/05 0529) Pulse Rate:  [80-81] 80 (03/05 0529) Resp:  [20] 20 (03/05 0529) BP: (150-189)/(59-73) 182/69 mmHg (03/05 0529) SpO2:  [92 %-94 %] 92 % (03/05 0529) Weight:  [216 lb 11.4 oz (98.3 kg)] 216 lb 11.4 oz (98.3 kg) (03/05 0529) Last BM Date: 03/02/13 General:   Alert,  Well-developed, well-nourished, pleasant and cooperative in NAD Head:  Normocephalic and atraumatic. Eyes:  Sclera clear, no icterus.  Abdomen:  Soft, right upper quadrant tenderness and nondistended.  Normal bowel sounds, without guarding, and without rebound.   Extremities:  Without clubbing, deformity or edema. Neurologic:  Alert and  oriented x4;  grossly normal neurologically. Skin:  Intact without significant lesions or rashes. Psych:  Alert and cooperative. Normal mood and affect.  Intake/Output from previous day: 03/04 0701 - 03/05 0700 In: 240 [P.O.:240] Out: 1625 [Urine:1625] Intake/Output this shift: Total I/O In: 120 [P.O.:120] Out: 500 [Urine:500]  Lab Results: CBC  Recent Labs  03/04/13 1422 03/05/13 0607 03/07/13 0533  WBC 9.3 12.3* 8.9  HGB 12.6 12.4 12.3   HCT 38.9 38.2 38.5  MCV 93.5 92.5 93.2  PLT 369 365 349   BMET  Recent Labs  03/04/13 1422 03/05/13 0607 03/07/13 0533  NA 139 142 139  K 3.6* 3.4* 4.0  CL 99 105 105  CO2 25 23 23   GLUCOSE 170* 128* 112*  BUN 15 9 11   CREATININE 0.61 0.54 0.60  CALCIUM 9.5 9.1 8.9   LFTs  Recent Labs  03/04/13 1422 03/07/13 0533  BILITOT 0.5 0.5  ALKPHOS 79 69  AST 15 17  ALT 13 14  PROT 7.8 6.5  ALBUMIN 3.4* 2.9*    Recent Labs  03/04/13 1440  LIPASE 15   PT/INR  Recent Labs  03/04/13 1440  LABPROT 13.7  INR 1.07      Imaging Studies: Dg Chest 2 View  03/04/2013   CLINICAL DATA:  Nausea, vomiting and tachypnea. History of hypertension and thrombocytopenia.  EXAM: CHEST  2 VIEW  COMPARISON:  CT ABD - PELV W/ CM dated 03/04/2013; DG CHEST 2 VIEW dated 05/04/2004  FINDINGS: Subpulmonic right pleural effusion and adjacent right lower lobe atelectasis are unchanged from the earlier CT. The left lung is clear. The heart size and mediastinal contours are stable without evidence of pericardial effusion. There is no pneumothorax.  IMPRESSION: Stable subpulmonic right pleural effusion and right basilar atelectasis.   Electronically Signed   By: Roxy HorsemanBill  Veazey M.D.   On: 03/04/2013 21:16   Ct Abdomen Pelvis W Contrast  03/04/2013   CLINICAL DATA:  Nausea and generalized weakness with left lower quadrant pain and vomiting  EXAM: CT ABDOMEN AND PELVIS WITH CONTRAST  TECHNIQUE: Multidetector CT imaging of the abdomen and pelvis was performed using the standard protocol following bolus administration of intravenous contrast.  CONTRAST:  OMNIPAQUE IOHEXOL 300 MG/ML SOLN intravenously. The patient did not receive oral contrast material.  COMPARISON:  CT ABD/PELV WO/W dated 01/22/2013  FINDINGS: The subcapsular hemorrhage within the liver is better defined today. Its maximal AP dimension is approximately 11.9 cm. Its maximal transverse dimension is approximately 9.8 cm. Its maximal longitudinal  dimension is approximately 12.4 cm. There is persistent low density in addition to the subcapsular hemorrhage that lies along the dome of the liver and extends along the right lateral surface of the liver. There is no intrahepatic ductal dilation. The liver enhances reasonably well and exhibits no suspicious mass.  The gallbladder is mildly distended and contains a large rim calcified stone which measures approximately 2.4 x 1.6 cm. There is no pericholecystic fluid. The pancreas, spleen, nondistended stomach, adrenal glands, and kidneys are normal in appearance. The caliber of the abdominal aorta is normal. The opacified loops of small and large bowel exhibit no evidence of ileus or of obstruction. There are diverticula throughout the colon, but there are no findings to suggest acute diverticulitis. The urinary bladder and surrounding pelvic soft tissues appear normal. The uterus is surgically absent. There are no adnexal masses. There is no inguinal or umbilical hernia. The psoas musculature is normal in appearance.  There is a small right pleural effusion which has increased in size since the previous study. There is minimal atelectasis adjacent to the pleural effusion. There is no left pleural effusion. The lumbar vertebral bodies are preserved in height. The bony pelvis exhibits no acute abnormality. There are mild degenerative changes of the SI joints.  IMPRESSION: 1. The large subcapsular hemorrhage within the liver has become better defined and is slightly smaller today. There is no evidence of acute hemorrhage. No hepatic mass is demonstrated. 2. There is a large gallstone present without sonographic evidence of acute cholecystitis. 3. The right-sided pleural effusion is increased in size. There is basilar atelectasis on the right. 4. There are scattered colonic diverticula but there is no evidence of acute diverticulitis.   Electronically Signed   By: [name]  [place]   On: 03/04/2013 20:12   Mr Liver W Wo  Contrast  03/05/2013   CLINICAL DATA:  Right-sided abdominal pain with nausea and vomiting. Hepatic subcapsular hematoma.  EXAM: MRI ABDOMEN WITHOUT AND WITH CONTRAST  TECHNIQUE: Multiplanar, multisequence MR imaging was performed both before and after administration of intravenous contrast.  CONTRAST:  20mL MULTIHANCE GADOBENATE DIMEGLUMINE 529 MG/ML IV SOLN  COMPARISON:  CT ABD - PELV W/ CM dated 03/04/2013; CT ABD/PELV WO/W dated 01/22/2013; US ABDOMEN COMPLETE dated 01/21/2013  FINDINGS: Examination is mildly motion degraded, especially on the post-contrast images. Again demonstrated is a large complex subcapsular hepatic hematoma. The largest component is peripheral to the inferior aspect of the right hepatic lobe and deforms it. This component measures 10.7 x 8.3 cm transverse and 12.9 cm cephalocaudad. There are smaller components extending superior laterally over the dome of the right hepatic lobe. This hematoma is multi-septated with T2 hyperintensity and intrinsic T1 shortening. The postcontrast images demonstrate no abnormal enhancement (best seen on the subtracted images). No underlying intraparenchymal lesion is seen.  A large gallstone is again noted. There is no gallbladder wall thickening or biliary dilatation. The pancreas appears normal. There is a duodenal diverticulum. The spleen, adrenal glands and kidneys demonstrate no significant findings. There is a tiny  cyst in the interpolar region of the left kidney. There is no hydronephrosis.  Small right-greater-than-left pleural effusions and right lower lobe atelectasis are stable. There is minimal ascites. No other focal intra-abdominal fluid collections are demonstrated.  IMPRESSION: 1. Large subcapsular hepatic hematoma is unchanged from the CT performed yesterday. This deforms the right hepatic lobe inferiorly. 2. No evidence of underlying mass lesion. 3. Stable right-greater-than-left pleural effusions and right lower lobe atelectasis. 4.  Cholelithiasis.   Electronically Signed   By: Roxy Horseman M.D.   On: 03/05/2013 10:30   Nm Hepato W/eject Fract  03/05/2013   CLINICAL DATA:  80 year old female with abdominal pain. Known right hepatic lobe large subcapsular hematoma, without underlying mass lesion evident by abdomen MRI. Initial encounter.  EXAM: NUCLEAR MEDICINE HEPATOBILIARY IMAGING WITH GALLBLADDER EF  TECHNIQUE: Sequential images of the abdomen were obtained out to 60 minutes following intravenous administration of radiopharmaceutical. After slow intravenous infusion of 1.93 micrograms Cholecystokinin, gallbladder ejection fraction was determined.  COMPARISON:  Abdomen ultrasound and MRI from the same day reported separately.  RADIOPHARMACEUTICALS:  5.0 mCiTc-88m Choletec  FINDINGS: Prompt radiotracer uptake by the liver and clearance of the blood pool.  Prominent oval shaped photopenic defect along the right hepatic lobe consistent with the subcapsular mass described on the comparisons today.  Prompt appearance of the central biliary tree and gallbladder, both visible by 10-15 min. Photopenic defect within the gallbladder consistent with the known cholelithiasis.  Small bowel activity by 30 min.  Gallbladder ejection fraction determine following CCK infusion to be 85.2% over 30 min. Normal ejection fraction is greater than 30%.  The patient did not experience symptoms during CCK infusion.  IMPRESSION: 1. Cholelithiasis but normal gallbladder ejection fraction. No biliary obstruction. 2. Right hepatic subcapsular liver lesion resulting and photopenic defect along the right liver contour. See MRI and ultrasound from today reported separately.   Electronically Signed   By: Augusto Gamble M.D.   On: 03/05/2013 13:33   US Abdomen Limited  03/05/2013   CLINICAL DATA:  Rule out cholecystitis.  EXAM: US ABDOMEN LIMITED - RIGHT UPPER QUADRANT  COMPARISON:  01/21/2013 as well as CT 03/04/2013 and 01/22/2013  FINDINGS: Gallbladder:  4 cm gallstone  present. No wall thickening and negative sonographic Murphy's sign.  Common bile duct:  Diameter: 3.5 mm.  Liver:  Large multi-septated complex cystic mass over the right lobe measuring approximately 9 x 12.6 x 15.7 cm with adjacent subcapsular complex cystic mass measuring 2.5 x 2.9 x 5.3 cm.  IMPRESSION: 4 cm gallstone. No additional sonographic evidence to suggest cholecystitis.  Two complex cystic liver masses in subcapsular location as seen on recent CTs suggesting improving subcapsular hemorrhagic masses/hematomas. Patient is undergoing MRI today for further evaluation of this process.   Electronically Signed   By: Elberta Fortis M.D.   On: 03/05/2013 11:00  [2 weeks]   Assessment: 80 year old lady with persistent/relapse of her upper abdominal discomfort, vomiting with known history of large subcapsular hepatic hematoma and large calcified gallstone. Workup thus far has been unremarkable for acute cholecystitis or biliary dyskinesia. It appears that her hematoma shows no evidence of acute hemorrhage and a slightly smaller. No evidence of underlying mass on MRI. Suspect pain and vomiting related to hematoma but unclear why she had relapse in the setting of improved findings on imaging.   Plan: 1. Plan for EGD today. Discussed with Dr. Jena Gauss. 2. Will prescribe ATC Zofran.    LOS: 3 days   Tana Coast  03/07/2013,  12:01 PM  Discussed plan with patient's nurse, patient and daughter via phone.  I have discussed the risks, alternatives, benefits with regards to but not limited to the risk of reaction to medication, bleeding, infection, perforation and the patient is agreeable to proceed. Written consent to be obtained.   Attending note:   Agree with the for EGD. I have offered her an EGD this afternoon.  The risks, benefits, limitations, alternatives and imponderables have been reviewed with the patient. Potential for  biopsy, etc. have also been reviewed.  Questions have been answered. All  parties agreeable.

## 2013-03-07 NOTE — Progress Notes (Signed)
Whitney [name] IZT:[id number] DOB: [birth year] DOA: 03/04/2013 PCP: Milana Obey, MD   Subjective: This lady was admitted  with nausea and vomiting. She continues to have this on and off for the last month or so and also has had right sided abdominal discomfort/pain. She was found to have a liver subcapsular hemorrhage, which has now become better defined and slightly smaller than previously documented.  She continues to have abdominal discomfort with nausea but she is somewhat tolerating a clear fluid diet. MRI of the liver and is not suggestive of a malignant mass. Today, she says that she is slightly better but not completely improved. She was seen by surgery and gastroenterology yesterday and she may possibly have an EGD today.           Physical Exam: Blood pressure 182/69, pulse 80, temperature 98.7 F (37.1 C), temperature source Oral, resp. rate 20, height 5\' 5"  (1.651 m), weight 98.3 kg (216 lb 11.4 oz), SpO2 92.00%. She looks systemically well. She does not appear to be in acute pain. She is nontoxic or septic. Heart sounds are present without murmurs. Lung fields are clear. Abdomen is soft but is tender over the right upper quadrant .She is alert and oriented.   Investigations:     Basic Metabolic Panel:  Recent Labs  38/25/05 0607 03/07/13 0533  NA 142 139  K 3.4* 4.0  CL 105 105  CO2 23 23  GLUCOSE 128* 112*  BUN 9 11  CREATININE 0.54 0.60  CALCIUM 9.1 8.9   Liver Function Tests:  Recent Labs  03/04/13 1422 03/07/13 0533  AST 15 17  ALT 13 14  ALKPHOS 79 69  BILITOT 0.5 0.5  PROT 7.8 6.5  ALBUMIN 3.4* 2.9*     CBC:  Recent Labs  03/04/13 1422 03/05/13 0607 03/07/13 0533  WBC 9.3 12.3* 8.9  NEUTROABS 7.9*  --   --   HGB 12.6 12.4 12.3  HCT 38.9 38.2 38.5  MCV 93.5 92.5 93.2  PLT 369 365 349    Mr Liver W Wo Contrast  03/05/2013   CLINICAL DATA:  Right-sided abdominal pain with nausea and vomiting. Hepatic subcapsular  hematoma.  EXAM: MRI ABDOMEN WITHOUT AND WITH CONTRAST  TECHNIQUE: Multiplanar, multisequence MR imaging was performed both before and after administration of intravenous contrast.  CONTRAST:  20mL MULTIHANCE GADOBENATE DIMEGLUMINE 529 MG/ML IV SOLN  COMPARISON:  CT ABD - PELV W/ CM dated 03/04/2013; CT ABD/PELV WO/W dated 01/22/2013; US ABDOMEN COMPLETE dated 01/21/2013  FINDINGS: Examination is mildly motion degraded, especially on the post-contrast images. Again demonstrated is a large complex subcapsular hepatic hematoma. The largest component is peripheral to the inferior aspect of the right hepatic lobe and deforms it. This component measures 10.7 x 8.3 cm transverse and 12.9 cm cephalocaudad. There are smaller components extending superior laterally over the dome of the right hepatic lobe. This hematoma is multi-septated with T2 hyperintensity and intrinsic T1 shortening. The postcontrast images demonstrate no abnormal enhancement (best seen on the subtracted images). No underlying intraparenchymal lesion is seen.  A large gallstone is again noted. There is no gallbladder wall thickening or biliary dilatation. The pancreas appears normal. There is a duodenal diverticulum. The spleen, adrenal glands and kidneys demonstrate no significant findings. There is a tiny cyst in the interpolar region of the left kidney. There is no hydronephrosis.  Small right-greater-than-left pleural effusions and right lower lobe atelectasis are stable. There is minimal ascites. No other focal intra-abdominal fluid collections are demonstrated.  IMPRESSION: 1. Large subcapsular hepatic hematoma is unchanged from the CT performed yesterday. This deforms the right hepatic lobe inferiorly. 2. No evidence of underlying mass lesion. 3. Stable right-greater-than-left pleural effusions and right lower lobe atelectasis. 4. Cholelithiasis.   Electronically Signed   By: Roxy Horseman M.D.   On: 03/05/2013 10:30   Nm Hepato W/eject  Fract  03/05/2013   CLINICAL DATA:  80 year old female with abdominal pain. Known right hepatic lobe large subcapsular hematoma, without underlying mass lesion evident by abdomen MRI. Initial encounter.  EXAM: NUCLEAR MEDICINE HEPATOBILIARY IMAGING WITH GALLBLADDER EF  TECHNIQUE: Sequential images of the abdomen were obtained out to 60 minutes following intravenous administration of radiopharmaceutical. After slow intravenous infusion of 1.93 micrograms Cholecystokinin, gallbladder ejection fraction was determined.  COMPARISON:  Abdomen ultrasound and MRI from the same day reported separately.  RADIOPHARMACEUTICALS:  5.0 mCiTc-74m Choletec  FINDINGS: Prompt radiotracer uptake by the liver and clearance of the blood pool.  Prominent oval shaped photopenic defect along the right hepatic lobe consistent with the subcapsular mass described on the comparisons today.  Prompt appearance of the central biliary tree and gallbladder, both visible by 10-15 min. Photopenic defect within the gallbladder consistent with the known cholelithiasis.  Small bowel activity by 30 min.  Gallbladder ejection fraction determine following CCK infusion to be 85.2% over 30 min. Normal ejection fraction is greater than 30%.  The patient did not experience symptoms during CCK infusion.  IMPRESSION: 1. Cholelithiasis but normal gallbladder ejection fraction. No biliary obstruction. 2. Right hepatic subcapsular liver lesion resulting and photopenic defect along the right liver contour. See MRI and ultrasound from today reported separately.   Electronically Signed   By: Augusto Gamble M.D.   On: 03/05/2013 13:33   US Abdomen Limited  03/05/2013   CLINICAL DATA:  Rule out cholecystitis.  EXAM: US ABDOMEN LIMITED - RIGHT UPPER QUADRANT  COMPARISON:  01/21/2013 as well as CT 03/04/2013 and 01/22/2013  FINDINGS: Gallbladder:  4 cm gallstone present. No wall thickening and negative sonographic Murphy's sign.  Common bile duct:  Diameter: 3.5 mm.  Liver:   Large multi-septated complex cystic mass over the right lobe measuring approximately 9 x 12.6 x 15.7 cm with adjacent subcapsular complex cystic mass measuring 2.5 x 2.9 x 5.3 cm.  IMPRESSION: 4 cm gallstone. No additional sonographic evidence to suggest cholecystitis.  Two complex cystic liver masses in subcapsular location as seen on recent CTs suggesting improving subcapsular hemorrhagic masses/hematomas. Patient is undergoing MRI today for further evaluation of this process.   Electronically Signed   By: Elberta Fortis M.D.   On: 03/05/2013 11:00      Medications: I have reviewed the patient's current medications.  Impression: 1. Nausea and vomiting, unclear etiology at the present time. 2. Liver mass, likely not malignant. 3. Large gallstone present without evidence of acute cholecystitis. 4. Hypertension, uncontrolled.     Plan: 1. Further adjust antihypertensive medications to control blood pressure. 2. Await further recommendations from gastroenterology, possibly EGD today. She is not a surgical candidate for cholecystectomy at the present time with the hematoma present in the liver.   Consultants:  Gastroenterology and surgery.   Procedures:  None.   Antibiotics:  None.                   Code Status: Full code.  Family Communication: Discussed plan with patient at the bedside.   Disposition Plan: Home when medically stable.  Time spent: 15 minutes.   LOS:  3 days   GOSRANI,NIMISH C   03/07/2013, 8:06 AM

## 2013-03-08 LAB — BASIC METABOLIC PANEL
BUN: 11 mg/dL (ref 6–23)
CALCIUM: 8.7 mg/dL (ref 8.4–10.5)
CHLORIDE: 104 meq/L (ref 96–112)
CO2: 22 mEq/L (ref 19–32)
Creatinine, Ser: 0.66 mg/dL (ref 0.50–1.10)
GFR calc Af Amer: >90 mL/min (ref >90)
GFR calc non Af Amer: 82 mL/min — ABNORMAL LOW (ref >90)
Glucose, Bld: 102 mg/dL — ABNORMAL HIGH (ref 70–99)
Potassium: 3.6 mEq/L — ABNORMAL LOW (ref 3.7–5.3)
Sodium: 138 mEq/L (ref 137–147)

## 2013-03-08 MED ORDER — ONDANSETRON HCL 4 MG PO TABS: 4.0000 mg | ORAL_TABLET | Freq: Four times a day (QID) | ORAL | Status: DC | PRN

## 2013-03-08 MED ORDER — ONDANSETRON HCL 4 MG/2ML IJ SOLN
4.0000 mg | INTRAMUSCULAR | Status: DC | PRN
  Administered 2013-03-08: 4 mg via INTRAVENOUS

## 2013-03-08 MED ORDER — ONDANSETRON HCL 4 MG/2ML IJ SOLN
4.0000 mg | Freq: Three times a day (TID) | INTRAMUSCULAR | Status: DC
  Administered 2013-03-08 – 2013-03-09 (×3): 4 mg via INTRAVENOUS
  Filled 2013-03-08 (×3): qty 2

## 2013-03-08 MED ORDER — PANTOPRAZOLE SODIUM 40 MG PO TBEC
40.0000 mg | DELAYED_RELEASE_TABLET | Freq: Two times a day (BID) | ORAL | Status: DC
Start: 2013-03-08 — End: 2013-03-10
  Administered 2013-03-08 – 2013-03-10 (×4): 40 mg via ORAL
  Filled 2013-03-08 (×4): qty 1

## 2013-03-08 MED ORDER — DOCUSATE SODIUM 100 MG PO CAPS
100.0000 mg | ORAL_CAPSULE | Freq: Two times a day (BID) | ORAL | Status: DC
  Administered 2013-03-08 – 2013-03-09 (×3): 100 mg via ORAL
  Filled 2013-03-08 (×3): qty 1

## 2013-03-08 NOTE — Progress Notes (Signed)
Subjective:  Feeling better today. Some nausea but no more vomiting. Denies abdominal pain at this time. Small bowel movement today.  Objective: Vital signs in last 24 hours: Temp:  [98 F (36.7 C)-98.8 F (37.1 C)] 98.8 F (37.1 C) (03/06 0535) Pulse Rate:  [87-100] 100 (03/06 1024) Resp:  [20-28] 20 (03/06 0535) BP: (119-176)/(48-81) 176/81 mmHg (03/06 0535) SpO2:  [95 %-98 %] 96 % (03/06 0535) FiO2 (%):  [98 %] 98 % (03/05 1635) Weight:  [217 lb 13 oz (98.8 kg)] 217 lb 13 oz (98.8 kg) (03/06 0535) Last BM Date: 03/02/13 General:   Alert,  Well-developed, well-nourished, pleasant and cooperative in NAD. Daughter at bedside. Head:  Normocephalic and atraumatic. Eyes:  Sclera clear, no icterus.   Abdomen:  Soft, nontender and nondistended.   Normal bowel sounds, without guarding, and without rebound.  Fullness ruq. Extremities:  Without clubbing, deformity or edema. Neurologic:  Alert and  oriented x4;  grossly normal neurologically. Skin:  Intact without significant lesions or rashes. Psych:  Alert and cooperative. Normal mood and affect.  Intake/Output from previous day: 03/05 0701 - 03/06 0700 In: 1793.3 [P.O.:720; I.V.:1073.3] Out: 1000 [Urine:1000] Intake/Output this shift: Total I/O In: 240 [P.O.:240] Out: -   Lab Results: CBC  Recent Labs  03/07/13 0533  WBC 8.9  HGB 12.3  HCT 38.5  MCV 93.2  PLT 349   BMET  Recent Labs  03/07/13 0533 03/08/13 0515  NA 139 138  K 4.0 3.6*  CL 105 104  CO2 23 22  GLUCOSE 112* 102*  BUN 11 11  CREATININE 0.60 0.66  CALCIUM 8.9 8.7   LFTs  Recent Labs  03/07/13 0533  BILITOT 0.5  ALKPHOS 69  AST 17  ALT 14  PROT 6.5  ALBUMIN 2.9*   No results found for this basename: LIPASE,  in the last 72 hours PT/INR No results found for this basename: LABPROT, INR,  in the last 72 hours    Imaging Studies: Dg Chest 2 View  03/04/2013   CLINICAL DATA:  Nausea, vomiting and tachypnea. History of  hypertension and thrombocytopenia.  EXAM: CHEST  2 VIEW  COMPARISON:  CT ABD - PELV W/ CM dated 03/04/2013; DG CHEST 2 VIEW dated 05/04/2004  FINDINGS: Subpulmonic right pleural effusion and adjacent right lower lobe atelectasis are unchanged from the earlier CT. The left lung is clear. The heart size and mediastinal contours are stable without evidence of pericardial effusion. There is no pneumothorax.  IMPRESSION: Stable subpulmonic right pleural effusion and right basilar atelectasis.   Electronically Signed   By: Roxy Horseman M.D.   On: 03/04/2013 21:16   Ct Abdomen Pelvis W Contrast  03/04/2013   CLINICAL DATA:  Nausea and generalized weakness with left lower quadrant pain and vomiting  EXAM: CT ABDOMEN AND PELVIS WITH CONTRAST  TECHNIQUE: Multidetector CT imaging of the abdomen and pelvis was performed using the standard protocol following bolus administration of intravenous contrast.  CONTRAST:  OMNIPAQUE IOHEXOL 300 MG/ML SOLN intravenously. The patient did not receive oral contrast material.  COMPARISON:  CT ABD/PELV WO/W dated 01/22/2013  FINDINGS: The subcapsular hemorrhage within the liver is better defined today. Its maximal AP dimension is approximately 11.9 cm. Its maximal transverse dimension is approximately 9.8 cm. Its maximal longitudinal dimension is approximately 12.4 cm. There is persistent low density in addition to the subcapsular hemorrhage that lies along the dome of the liver and extends along the right lateral surface of  the liver. There is no intrahepatic ductal dilation. The liver enhances reasonably well and exhibits no suspicious mass.  The gallbladder is mildly distended and contains a large rim calcified stone which measures approximately 2.4 x 1.6 cm. There is no pericholecystic fluid. The pancreas, spleen, nondistended stomach, adrenal glands, and kidneys are normal in appearance. The caliber of the abdominal aorta is normal. The opacified loops of small and large bowel exhibit  no evidence of ileus or of obstruction. There are diverticula throughout the colon, but there are no findings to suggest acute diverticulitis. The urinary bladder and surrounding pelvic soft tissues appear normal. The uterus is surgically absent. There are no adnexal masses. There is no inguinal or umbilical hernia. The psoas musculature is normal in appearance.  There is a small right pleural effusion which has increased in size since the previous study. There is minimal atelectasis adjacent to the pleural effusion. There is no left pleural effusion. The lumbar vertebral bodies are preserved in height. The bony pelvis exhibits no acute abnormality. There are mild degenerative changes of the SI joints.  IMPRESSION: 1. The large subcapsular hemorrhage within the liver has become better defined and is slightly smaller today. There is no evidence of acute hemorrhage. No hepatic mass is demonstrated. 2. There is a large gallstone present without sonographic evidence of acute cholecystitis. 3. The right-sided pleural effusion is increased in size. There is basilar atelectasis on the right. 4. There are scattered colonic diverticula but there is no evidence of acute diverticulitis.   Electronically Signed   By: [name]  [place]   On: 03/04/2013 20:12   Mr Liver W Wo Contrast  03/05/2013   CLINICAL DATA:  Right-sided abdominal pain with nausea and vomiting. Hepatic subcapsular hematoma.  EXAM: MRI ABDOMEN WITHOUT AND WITH CONTRAST  TECHNIQUE: Multiplanar, multisequence MR imaging was performed both before and after administration of intravenous contrast.  CONTRAST:  20mL MULTIHANCE GADOBENATE DIMEGLUMINE 529 MG/ML IV SOLN  COMPARISON:  CT ABD - PELV W/ CM dated 03/04/2013; CT ABD/PELV WO/W dated 01/22/2013; US ABDOMEN COMPLETE dated 01/21/2013  FINDINGS: Examination is mildly motion degraded, especially on the post-contrast images. Again demonstrated is a large complex subcapsular hepatic hematoma. The largest component is  peripheral to the inferior aspect of the right hepatic lobe and deforms it. This component measures 10.7 x 8.3 cm transverse and 12.9 cm cephalocaudad. There are smaller components extending superior laterally over the dome of the right hepatic lobe. This hematoma is multi-septated with T2 hyperintensity and intrinsic T1 shortening. The postcontrast images demonstrate no abnormal enhancement (best seen on the subtracted images). No underlying intraparenchymal lesion is seen.  A large gallstone is again noted. There is no gallbladder wall thickening or biliary dilatation. The pancreas appears normal. There is a duodenal diverticulum. The spleen, adrenal glands and kidneys demonstrate no significant findings. There is a tiny cyst in the interpolar region of the left kidney. There is no hydronephrosis.  Small right-greater-than-left pleural effusions and right lower lobe atelectasis are stable. There is minimal ascites. No other focal intra-abdominal fluid collections are demonstrated.  IMPRESSION: 1. Large subcapsular hepatic hematoma is unchanged from the CT performed yesterday. This deforms the right hepatic lobe inferiorly. 2. No evidence of underlying mass lesion. 3. Stable right-greater-than-left pleural effusions and right lower lobe atelectasis. 4. Cholelithiasis.   Electronically Signed   By: Roxy Horseman M.D.   On: 03/05/2013 10:30   Nm Hepato W/eject Fract  03/05/2013   CLINICAL DATA:  80 year old female with abdominal  pain. Known right hepatic lobe large subcapsular hematoma, without underlying mass lesion evident by abdomen MRI. Initial encounter.  EXAM: NUCLEAR MEDICINE HEPATOBILIARY IMAGING WITH GALLBLADDER EF  TECHNIQUE: Sequential images of the abdomen were obtained out to 60 minutes following intravenous administration of radiopharmaceutical. After slow intravenous infusion of 1.93 micrograms Cholecystokinin, gallbladder ejection fraction was determined.  COMPARISON:  Abdomen ultrasound and MRI from  the same day reported separately.  RADIOPHARMACEUTICALS:  5.0 mCiTc-5170m Choletec  FINDINGS: Prompt radiotracer uptake by the liver and clearance of the blood pool.  Prominent oval shaped photopenic defect along the right hepatic lobe consistent with the subcapsular mass described on the comparisons today.  Prompt appearance of the central biliary tree and gallbladder, both visible by 10-15 min. Photopenic defect within the gallbladder consistent with the known cholelithiasis.  Small bowel activity by 30 min.  Gallbladder ejection fraction determine following CCK infusion to be 85.2% over 30 min. Normal ejection fraction is greater than 30%.  The patient did not experience symptoms during CCK infusion.  IMPRESSION: 1. Cholelithiasis but normal gallbladder ejection fraction. No biliary obstruction. 2. Right hepatic subcapsular liver lesion resulting and photopenic defect along the right liver contour. See MRI and ultrasound from today reported separately.   Electronically Signed   By: Augusto GambleLee  Hall M.D.   On: 03/05/2013 13:33   [place] Abdomen Limited  03/05/2013   CLINICAL DATA:  Rule out cholecystitis.  EXAM: US ABDOMEN LIMITED - RIGHT UPPER QUADRANT  COMPARISON:  01/21/2013 as well as CT 03/04/2013 and 01/22/2013  FINDINGS: Gallbladder:  4 cm gallstone present. No wall thickening and negative sonographic Murphy's sign.  Common bile duct:  Diameter: 3.5 mm.  Liver:  Large multi-septated complex cystic mass over the right lobe measuring approximately 9 x 12.6 x 15.7 cm with adjacent subcapsular complex cystic mass measuring 2.5 x 2.9 x 5.3 cm.  IMPRESSION: 4 cm gallstone. No additional sonographic evidence to suggest cholecystitis.  Two complex cystic liver masses in subcapsular location as seen on recent CTs suggesting improving subcapsular hemorrhagic masses/hematomas. Patient is undergoing MRI today for further evaluation of this process.   Electronically Signed   By: Elberta Fortisaniel  Boyle M.D.   On: 03/05/2013 11:00  [2  weeks]   Assessment: 80 year old lady with persistent/relapse of her upper abdominal discomfort, vomiting with known history of large subcapsular hepatic hematoma and large calcified gallstone. Workup thus far has been unremarkable for acute cholecystitis or biliary dyskinesia. It appears that her hematoma shows no evidence of acute hemorrhage and a slightly smaller. No evidence of underlying mass on MRI.   EGD yesterday showed erosive/ulcerative reflux esophagitis and inflamed appearing gastric mucosa with submucosal edema extending down into the antrum and through the pyloric channel.   Plan: 1. Followup biopsies. 2. Continue twice a day PPI for two months, then daily. 3. Carafate for 10 more days. 4. She will likely need followup imaging of her liver in 6-8 weeks or so, to discuss with Dr. Darrick PennaFields.   LOS: 4 days   Tana CoastLeslie Lewis  03/08/2013, 2:47 PM

## 2013-03-08 NOTE — Progress Notes (Signed)
0900 Changed diet from clear liquid because the patient stated that she wanted to eat but she also still complains of nausea.  I told her we would see how she would do and if all is well we would increase her lunch diet.   1130  Changed the diet from full liquid to a cardiac diet due to her hx of heart disease.  Patient has not complaint of any further nausea or vomiting since breakfast.  I will continue to monitor her.  She agrees with the increasing of the diet.

## 2013-03-08 NOTE — Plan of Care (Signed)
Problem: Phase II Progression Outcomes Goal: Discharge plan established Outcome: Progressing Progressed her diet today from clear to cardiac.

## 2013-03-08 NOTE — Progress Notes (Signed)
S: Patient complained of nausea and vomiting with assessment.  B:  Admission Dx vomiting  A:  Minimal spit up noticed in her kidney basin.  Pt verbalized that she was nauseated.    R: Change Zofran PRN order from Q 6 hours to Q 4 hours.    New orders received and followed.

## 2013-03-08 NOTE — Progress Notes (Addendum)
REVIEWED. OK TO STOP CARAFATE ON DISCHARGE.

## 2013-03-08 NOTE — Progress Notes (Signed)
  Liliane ShiStella A Fink YQM:[id number] DOB: Jul 20, 1931 DOA: 03/04/2013 PCP: Milana ObeyKNOWLTON,STEPHEN D, MD   Subjective: This lady was admitted  with nausea and vomiting. She continues to have this on and off for the last month or so and also has had right sided abdominal discomfort/pain. She was found to have a liver subcapsular hemorrhage, which has now become better defined and slightly smaller than previously documented.  She continues to have abdominal discomfort with nausea but she is somewhat tolerating a clear fluid diet. MRI of the liver and is not suggestive of a malignant mass. Yesterday, she underwent EGD by Dr. Kendell Baneourke. This showed erosive and ulcerative reflux esophagitis. Also the gastric mucosa was rather inflamed. This may account for some of her symptoms.           Physical Exam: Blood pressure 176/81, pulse 93, temperature 98.8 F (37.1 C), temperature source Oral, resp. rate 20, height 5\' 5"  (1.651 m), weight 98.8 kg (217 lb 13 oz), SpO2 96.00%. She looks systemically well. She does not appear to be in acute pain. She is nontoxic or septic. Heart sounds are present without murmurs. Lung fields are clear. Abdomen is soft and she is not as tender as she was before. She is alert and oriented.   Investigations:     Basic Metabolic Panel:  Recent Labs  52/84/1301/05/17 0533  NA 139  K 4.0  CL 105  CO2 23  GLUCOSE 112*  BUN 11  CREATININE 0.60  CALCIUM 8.9   Liver Function Tests:  Recent Labs  03/07/13 0533  AST 17  ALT 14  ALKPHOS 69  BILITOT 0.5  PROT 6.5  ALBUMIN 2.9*     CBC:  Recent Labs  03/07/13 0533  WBC 8.9  HGB 12.3  HCT 38.5  MCV 93.2  PLT 349    No results found.    Medications: I have reviewed the patient's current medications.  Impression: 1. Nausea and vomiting, etiology is likely multifactorial with a combination of the presence of gallstone and erosive and ulcerative reflux esophagitis. 2. Liver mass, hemorrhagic, improving. 3.  Large gallstone present without evidence of acute cholecystitis. 4. Hypertension, uncontrolled.     Plan: 1. Discontinue IV fluids and encourage oral intake. 2. Advance diet. 3. Mobilize. 4. Hopefully she can be discharged tomorrow.   Consultants:  Gastroenterology and surgery.   Procedures:  None.   Antibiotics:  None.                   Code Status: Full code.  Family Communication: Discussed plan with patient at the bedside.   Disposition Plan: Home when medically stable.  Time spent: 15 minutes.   LOS: 4 days   GOSRANI,NIMISH C   03/08/2013, 6:18 AM

## 2013-03-09 DIAGNOSIS — R112 Nausea with vomiting, unspecified: Secondary | ICD-10-CM

## 2013-03-09 DIAGNOSIS — R109 Unspecified abdominal pain: Secondary | ICD-10-CM

## 2013-03-09 LAB — CBC
HEMATOCRIT: 37.2 % (ref 36.0–46.0)
Hemoglobin: 12.2 g/dL (ref 12.0–15.0)
MCH: 30.2 pg (ref 26.0–34.0)
MCHC: 32.8 g/dL (ref 30.0–36.0)
MCV: 92.1 fL (ref 78.0–100.0)
Platelets: 273 10*3/uL (ref 150–400)
RBC: 4.04 MIL/uL (ref 3.87–5.11)
RDW: 15.8 % — AB (ref 11.5–15.5)
WBC: 6.8 10*3/uL (ref 4.0–10.5)

## 2013-03-09 LAB — BASIC METABOLIC PANEL
BUN: 10 mg/dL (ref 6–23)
CO2: 24 mEq/L (ref 19–32)
CREATININE: 0.59 mg/dL (ref 0.50–1.10)
Calcium: 8.7 mg/dL (ref 8.4–10.5)
Chloride: 101 mEq/L (ref 96–112)
GFR calc non Af Amer: 85 mL/min — ABNORMAL LOW (ref >90)
Glucose, Bld: 153 mg/dL — ABNORMAL HIGH (ref 70–99)
POTASSIUM: 3 meq/L — AB (ref 3.7–5.3)
Sodium: 137 mEq/L (ref 137–147)

## 2013-03-09 MED ORDER — POTASSIUM CHLORIDE 10 MEQ/100ML IV SOLN
10.0000 meq | INTRAVENOUS | Status: AC
Start: 2013-03-09 — End: 2013-03-09
  Administered 2013-03-09 (×4): 10 meq via INTRAVENOUS
  Filled 2013-03-09 (×4): qty 100

## 2013-03-09 MED ORDER — ONDANSETRON HCL 4 MG/2ML IJ SOLN: 4.0000 mg | Freq: Four times a day (QID) | INTRAMUSCULAR | Status: DC | PRN

## 2013-03-09 MED ORDER — DOCUSATE SODIUM 100 MG PO CAPS: 100.0000 mg | ORAL_CAPSULE | Freq: Two times a day (BID) | ORAL | Status: DC | PRN

## 2013-03-09 NOTE — Progress Notes (Signed)
NAMAmes Coupe:  [name], Whitney         ACCOUNT NO.:  [id number]  MEDICAL RECORD NO.:  [id number]  LOCATION:  A313                          FACILITY:  [facility]  PHYSICIAN:  Melvyn Novasichard Michael Chelle Cayton, MDDATE OF BIRTH:  Mar 22, 1931  DATE OF PROCEDURE: DATE OF DISCHARGE:                                PROGRESS NOTE   SUBJECTIVE:  A 80 year old black female status post EGD showing severe erosive gastritis, inflammation of the antrum, Schatzki ring, hiatal hernia.  Today, she has hypokalemia, potassium of 3.0, tolerating a soft diet fairly well.  Hemoglobin is 12.  Recommendations of the PPI twice a day as well as Carafate q.i.d.  She likewise has hypertension and this is well controlled at 128/78.  OBJECTIVE:  LUNGS:  Clear.  No rales, wheeze, or rhonchi. HEART:  Regular rhythm.  No murmurs, gallops, or rubs. ABDOMEN:  No epigastric abdominal discomfort.  No detectable organomegaly.  PLAN:  Right now is to give KCl 10 mEq x4 runs.  Monitor the potassium in morning.  Continue PPI and Carafate as ordered and we will make further recommendations.     Melvyn Novasichard Michael [name], MD     RMD/MEDQ  D:  03/09/2013  T:  03/09/2013  Job:  [id number]

## 2013-03-09 NOTE — Progress Notes (Signed)
[id number] 

## 2013-03-09 NOTE — Progress Notes (Addendum)
Subjective: Since I last evaluated the patient she is tolerating pos. DOESN'T LIKE SOFT MECHANICAL DIET. ABD PAIN IMPROVED. HARD/WATERY STOOL THIS AM. DAUGHTER THINKS PT MUCH BETTER. PT WANTS TO KNOW WHEN SHE CAN GO HOME.  Objective: Vital signs in last 24 hours: Temp:  [97.8 F (36.6 C)-98.6 F (37 C)] 98.6 F (37 C) (03/07 0504) Pulse Rate:  [85-100] 88 (03/07 0504) Resp:  [20] 20 (03/07 0504) BP: (144-174)/(72-79) 166/72 mmHg (03/07 0504) SpO2:  [96 %-97 %] 97 % (03/07 0504) Weight:  [218 lb 0.6 oz (98.9 kg)] 218 lb 0.6 oz (98.9 kg) (03/07 0504) Last BM Date: 03/09/13  Intake/Output from previous day: 03/06 0701 - 03/07 0700 In: 1120 [P.O.:720; I.V.:400] Out: -  Intake/Output this shift:    General appearance: alert, cooperative and no distress Resp: clear to auscultation bilaterally Cardio: regular rate and rhythm GI: soft, MILDLY tender IN LUQ; bowel sounds normal; NO REBOUND OR GUARDING  Lab Results:  Recent Labs  03/07/13 0533 03/09/13 0713  WBC 8.9 6.8  HGB 12.3 12.2  HCT 38.5 37.2  PLT 349 273   BMET  Recent Labs  03/07/13 0533 03/08/13 0515 03/09/13 0713  NA 139 138 137  K 4.0 3.6* 3.0*  CL 105 104 101  CO2 23 22 24   GLUCOSE 112* 102* 153*  BUN 11 11 10   CREATININE 0.60 0.66 0.59  CALCIUM 8.9 8.7 8.7   LFT  Recent Labs  03/07/13 0533  PROT 6.5  ALBUMIN 2.9*  AST 17  ALT 14  ALKPHOS 69  BILITOT 0.5    Medications: I have reviewed the patient's current medications.  Assessment/Plan: ADMITTED WITH NAUSEA, VOMITING,A ND ABD PAIN. EGD REVEALED ESOPHAGITIS/GASTRITIS. CLINICALLY IMPROVED. ALSO NOTED TO HAVE LARGE GALLSTONE AND IMPROVING HEMATOMA.  PLAN: 1. OK TP D/C TO HOME ON LOW FTA DIET AS TOLERATED. PT AND DUAGHTER INSTRUCTED TO CHANGE TO CHOPPED OR GROUND MEATS AND AVOIBD CHUNKS OF FOOD IF SHE HAS DIFFICULTY SWALLOWING. 2. BID PPI FOR 3 MOS THEN ONCE DAILY FOREVER. OPV W/ DR. Clotee Schlicker IN 2 MOS 3. OPV WITH DR, Lovell SheehanJENKINS TO DISCUSS  BENEFITS V. RISKS OF ELECTIVE CHOLECYSTECTOMY.  [address] DR[address] DONDIEGO [phone] TO DISCUSS. AWAITING RETURN CALL.   LOS: 5 days   [facility] Psandi Atheena Spano 03/09/2013, 8:49 AM

## 2013-03-10 LAB — BASIC METABOLIC PANEL
BUN: 9 mg/dL (ref 6–23)
CALCIUM: 8.7 mg/dL (ref 8.4–10.5)
CO2: 24 mEq/L (ref 19–32)
CREATININE: 0.54 mg/dL (ref 0.50–1.10)
Chloride: 101 mEq/L (ref 96–112)
GFR calc non Af Amer: 87 mL/min — ABNORMAL LOW (ref >90)
Glucose, Bld: 107 mg/dL — ABNORMAL HIGH (ref 70–99)
Potassium: 3.3 mEq/L — ABNORMAL LOW (ref 3.7–5.3)
Sodium: 137 mEq/L (ref 137–147)

## 2013-03-10 MED ORDER — POTASSIUM CHLORIDE CRYS ER 20 MEQ PO TBCR: 20.0000 meq | EXTENDED_RELEASE_TABLET | Freq: Every day | ORAL | Status: DC

## 2013-03-10 MED ORDER — PANTOPRAZOLE SODIUM 40 MG PO TBEC: 40.0000 mg | DELAYED_RELEASE_TABLET | Freq: Two times a day (BID) | ORAL | Status: DC

## 2013-03-10 MED ORDER — POTASSIUM CHLORIDE CRYS ER 20 MEQ PO TBCR
40.0000 meq | EXTENDED_RELEASE_TABLET | Freq: Once | ORAL | Status: AC
  Administered 2013-03-10: 40 meq via ORAL
  Filled 2013-03-10: qty 2

## 2013-03-10 MED ORDER — SUCRALFATE 1 GM/10ML PO SUSP: 1.0000 g | Freq: Three times a day (TID) | ORAL | Status: DC

## 2013-03-10 NOTE — Discharge Summary (Signed)
NAMAmes Coupe:  Whitney [name], [name]         ACCOUNT NO.:  [id number]  MEDICAL RECORD NO.:  [id number]  LOCATION:  A313                          FACILITY:  [facility]  PHYSICIAN:  Melvyn Novasichard Michael Shloma Roggenkamp, MDDATE OF BIRTH:  1931/05/26  DATE OF ADMISSION:  03/04/2013 DATE OF DISCHARGE:  03/08/2015LH                              DISCHARGE SUMMARY   The patient is a 80 year old black female, patient of Dr. Oda KiltsNorton Gosrani, who was admitted to hospital with nausea and vomiting, and she has a history of hypertension, history of GERD, and lactose intolerance. She was given antiemetics in the form of Zofran and her GI symptoms subsided.  She was seen in consultation by GI, Dr. Darrick PennaFields, who performed an EGD revealing severe erosive gastritis and noncritical Schatzkis ring, and gastric and antral gastritis.  For this, she was prescribed Carafate 1 g q.i.d. as well as PPI Protonix 40 p.o. b.i.d. orally.  She was hemodynamically stable in the hospital.  She had no evidence of significant GI bleed.  No melena, stools, or hematochezia.  The patient was felt safe to go home after consulting with GI, and she was subsequently discharged on the following medicines; Lopressor 50 mg p.o. b.i.d., Protonix 40 mg p.o. b.i.d., Carafate 1 g suspension a.c. t.i.d. and bedtime, Lipitor 40 mg p.o. daily, diltiazem XR 180 p.o. daily, lactose-free boost, lisinopril 20 mg p.o. daily, lorazepam 0.5 mg p.o. t.i.d. p.r.n.  She was told to stop taking her oxycodone.  Her potassium was 3.3 on the day of discharge, she was given 40 mEq of potassium chloride 1 dose prior to discharge, and she is told to follow up with Dr. Oda KiltsNorton Gosrani within 2 to 3 days time for checking electrolytes, and she is not on any diuretic at present.     Melvyn Novasichard Michael Yarisbel Miranda, MD     RMD/MEDQ  D:  03/10/2013  T:  03/10/2013  Job:  [id number]

## 2013-03-10 NOTE — Discharge Summary (Signed)
[id number] 

## 2013-03-11 ENCOUNTER — Encounter (HOSPITAL_COMMUNITY): Payer: Self-pay | Admitting: Internal Medicine

## 2013-03-11 NOTE — Progress Notes (Signed)
UR chart review completed.  

## 2013-03-12 ENCOUNTER — Encounter: Payer: Self-pay | Admitting: Internal Medicine

## 2013-03-14 NOTE — Progress Notes (Signed)
[id number] 

## 2013-03-15 NOTE — Progress Notes (Signed)
[name], Whitney         ACCOUNT NO.:  [id number]  MEDICAL RECORD NO.:  [id number]  LOCATION:  A313                          FACILITY:  [facility]  PHYSICIAN:  Whitney [name], MDDATE OF BIRTH:  Oct 18, 1931  DATE OF PROCEDURE: DATE OF DISCHARGE:  03/10/2013                                PROGRESS NOTE   Her diffuse abdominal discomfort and upper GI discomfort was due to documented erosive gastritis, antral gastritis, and a noncritical Schatzki's ring which is documented by GI consultation, Dr. Jonette EvaSandi Fields, although the patient had cholelithiasis, this was noncontributory to her current symptoms while in this hospital.     Whitney Novasichard Michael Whitney Buntyn, MD     RMD/MEDQ  D:  03/14/2013  T:  03/15/2013  Job:  [id number]

## 2013-10-16 ENCOUNTER — Encounter: Payer: Self-pay | Admitting: Gastroenterology

## 2013-10-16 ENCOUNTER — Encounter: Payer: Self-pay | Admitting: Internal Medicine

## 2013-10-16 ENCOUNTER — Telehealth: Payer: Self-pay | Admitting: Gastroenterology

## 2013-10-16 NOTE — Telephone Encounter (Signed)
APPT MADE AND LETTER SENT  °

## 2013-10-16 NOTE — Telephone Encounter (Signed)
Please make routine office f/u for h/o large liver hematoma and to arrange for follow up imaging.

## 2013-11-04 ENCOUNTER — Encounter (HOSPITAL_COMMUNITY): Payer: Self-pay | Admitting: Internal Medicine

## 2013-11-13 ENCOUNTER — Encounter: Payer: Self-pay | Admitting: *Deleted

## 2013-11-19 ENCOUNTER — Encounter: Payer: Self-pay | Admitting: Gastroenterology

## 2013-11-19 ENCOUNTER — Ambulatory Visit (INDEPENDENT_AMBULATORY_CARE_PROVIDER_SITE_OTHER): Payer: Medicare Other | Admitting: Gastroenterology

## 2013-11-19 VITALS — BP 160/72 | HR 68 | Temp 97.3°F | Ht 65.0 in | Wt 211.0 lb

## 2013-11-19 DIAGNOSIS — K7689 Other specified diseases of liver: Secondary | ICD-10-CM

## 2013-11-19 NOTE — Patient Instructions (Signed)
1. MRI liver to follow up on hematoma as scheduled. We will call you with results.

## 2013-11-19 NOTE — Progress Notes (Signed)
      Primary Care Physician: Milana ObeyKNOWLTON,STEPHEN D, MD  Primary Gastroenterologist:  Jonette EvaSandi Fields, MD   Chief Complaint  Patient presents with  . Follow-up    HPI: Whitney [name] is a 80 y.o. female here for follow-up of large hepatic hematoma.   Couple of hospitalizations earlier this year for right upper quadrant abdominal pain associated with vomiting. History of large calcified gallstone and large subcapsular hepatic hematoma (without any known trauma). Last imaging was on 03/05/2013 via MRI liver. This showed a 10.7 x 8.3 by 12.9 cm hematoma. Multi septated, deforming the right hepatic lobe in early.  Rare RUQ pain. ?gas. Last for a few minutes at a time. No nausea or vomiting. No heartburn, constipation, diarrhea, melena, rectal bleeding. Recent labs with PCP. We have requested records.   Current Outpatient Prescriptions  Medication Sig Dispense Refill  . atorvastatin (LIPITOR) 40 MG tablet Take 40 mg by mouth daily.     Marland Kitchen. diltiazem (DILACOR XR) 180 MG 24 hr capsule Take 180 mg by mouth daily.    Marland Kitchen. lactose free nutrition (BOOST PLUS) LIQD Take 237 mLs by mouth 3 (three) times daily with meals.    Marland Kitchen. lisinopril (PRINIVIL,ZESTRIL) 20 MG tablet Take 20 mg by mouth daily.    Marland Kitchen. LORazepam (ATIVAN) 0.5 MG tablet Take 0.5-1 mg by mouth at bedtime as needed for sleep.    Marland Kitchen. omeprazole (PRILOSEC) 40 MG capsule 40 mg daily.     No current facility-administered medications for this visit.    Allergies as of 11/19/2013  . (No Known Allergies)    ROS:  General: Negative for anorexia, weight loss, fever, chills, fatigue, weakness. ENT: Negative for hoarseness, difficulty swallowing , nasal congestion. CV: Negative for chest pain, angina, palpitations, dyspnea on exertion, peripheral edema.  Respiratory: Negative for dyspnea at rest, dyspnea on exertion, cough, sputum, wheezing.  GI: See history of present illness. GU:  Negative for dysuria, hematuria, urinary incontinence,  urinary frequency, nocturnal urination.  Endo: Negative for unusual weight change.    Physical Examination:   BP 160/72 mmHg  Pulse 68  Temp(Src) 97.3 F (36.3 C) (Oral)  Ht 5\' 5"  (1.651 m)  Wt 211 lb (95.709 kg)  BMI 35.11 kg/m2  General: Well-nourished, well-developed in no acute distress.  Eyes: No icterus. Mouth: Oropharyngeal mucosa moist and pink , no lesions erythema or exudate. Lungs: Clear to auscultation bilaterally.  Heart: Regular rate and rhythm, no murmurs rubs or gallops.  Abdomen: Bowel sounds are normal, nontender, nondistended, no hepatosplenomegaly or masses, no abdominal bruits or hernia , no rebound or guarding.   Extremities: No lower extremity edema. No clubbing or deformities. Neuro: Alert and oriented x 4   Skin: Warm and dry, no jaundice.   Psych: Alert and cooperative, normal mood and affect.

## 2013-11-19 NOTE — Progress Notes (Signed)
Reviewed labs from PCP dated 09/25/2013.  BUN 11, creatinine 0.72, total bilirubin 0.4, alkaline phosphatase 70, AST 14, ALT 9, albumin 3.6, hemoglobin A1c 6.5, white blood cell count 4700, hemoglobin 13.1, platelets 166,000

## 2013-11-19 NOTE — Progress Notes (Signed)
Pt is set up for MRI on 11/22/13 9:45 am. Left message for her to call

## 2013-11-19 NOTE — Progress Notes (Signed)
cc'ed to pcp °

## 2013-11-19 NOTE — Assessment & Plan Note (Signed)
Clinically doing better. Occasional RUQ pain, possibly related to cholelithiasis. Recommend one more MRI liver to rule out underlying hepatic malignancy given spontaneous hepatic hematoma. Retrieve copy of labs from PCP for review.

## 2013-11-22 ENCOUNTER — Ambulatory Visit (HOSPITAL_COMMUNITY)
Admission: RE | Admit: 2013-11-22 | Discharge: 2013-11-22 | Disposition: A | Discharge status: Home / Self Care | Payer: Medicare Other | Source: Ambulatory Visit | Attending: Family Medicine | Admitting: Family Medicine

## 2013-11-22 DIAGNOSIS — K5710 Diverticulosis of small intestine without perforation or abscess without bleeding: Secondary | ICD-10-CM | POA: Diagnosis not present

## 2013-11-22 DIAGNOSIS — K7689 Other specified diseases of liver: Secondary | ICD-10-CM | POA: Diagnosis not present

## 2013-11-22 DIAGNOSIS — K8020 Calculus of gallbladder without cholecystitis without obstruction: Secondary | ICD-10-CM | POA: Diagnosis not present

## 2013-11-22 DIAGNOSIS — I7090 Unspecified atherosclerosis: Secondary | ICD-10-CM | POA: Insufficient documentation

## 2013-11-22 LAB — POCT I-STAT CREATININE: CREATININE: 0.8 mg/dL (ref 0.50–1.10)

## 2013-11-22 MED ORDER — GADOBENATE DIMEGLUMINE 529 MG/ML IV SOLN
20.0000 mL | Freq: Once | INTRAVENOUS | Status: AC | PRN
  Administered 2013-11-22: 20 mL via INTRAVENOUS

## 2013-11-27 NOTE — Progress Notes (Signed)
Quick Note:  Significant reduction in volume of the right hepatic lobe subcapsular hematoma. Labs from September 2015, total bilirubin 0.4, alkaline phosphatase 70, AST 14, ALT 9, albumin 3.6, hemoglobin A1c 6.5, white blood cell count 4700, hemoglobin 13.1, hematocrit 42, platelets 166,000  Suggest office visit with Dr. Darrick Pennafields only in 6 months for (history of liver hematoma, right upper quadrant pain) ______

## 2013-12-02 NOTE — Progress Notes (Signed)
Quick Note:  PT is aware. Routing to Darl PikesSusan to nic OV in 6 months with Dr. Darrick PennaFields. ______

## 2014-04-30 ENCOUNTER — Encounter: Payer: Self-pay | Admitting: Gastroenterology

## 2015-03-31 ENCOUNTER — Ambulatory Visit (INDEPENDENT_AMBULATORY_CARE_PROVIDER_SITE_OTHER): Payer: Self-pay | Admitting: Cardiovascular Disease

## 2015-03-31 ENCOUNTER — Encounter: Payer: Self-pay | Admitting: Cardiovascular Disease

## 2015-03-31 VITALS — BP 142/60 | HR 48 | Ht 66.0 in | Wt 232.2 lb

## 2015-03-31 DIAGNOSIS — E785 Hyperlipidemia, unspecified: Secondary | ICD-10-CM

## 2015-03-31 DIAGNOSIS — Z0000 Encounter for general adult medical examination without abnormal findings: Secondary | ICD-10-CM

## 2015-03-31 DIAGNOSIS — R001 Bradycardia, unspecified: Secondary | ICD-10-CM

## 2015-03-31 DIAGNOSIS — Z79899 Other long term (current) drug therapy: Secondary | ICD-10-CM

## 2015-03-31 MED ORDER — ATENOLOL 25 MG PO TABS: 12.5000 mg | ORAL_TABLET | Freq: Every day | ORAL | Status: AC

## 2015-03-31 NOTE — Progress Notes (Signed)
03/31/2015 Whitney [name]   October 08, 1931  [id number]  Primary Physician Milana Obey, MD Primary Cardiologist: Runell Gess MD Roseanne Reno   HPI:  The patient is a 82 year old moderately-overweight widowed African American female, mother of 7 and grandmother to 10 grandchildren who I last saw 10/26/12. She was referred by Dr. Sudie Bailey every 12 for evaluation of chest pain at that time. She lives with her daughter and son. Her risk factors include treated hypertension and hyperlipidemia. She does not smoke or drink, nor is there a family history. She has never had a heart attack or stroke. Her past surgical history is remarkable for a remote hysterectomy. Over the last 3 weeks she has noticed substernal chest pressure, which has occurred approximately 3 times, associated with weakness. There is some radiation to her left shoulder but no shortness of breath. She had a Myoview stress test performed 04/03/12 which is entirely normal. She's had no recurrent symptoms since I saw her last.   Current Outpatient Prescriptions  Medication Sig Dispense Refill  . atenolol (TENORMIN) 25 MG tablet Take 12.5 mg by mouth daily.  0  . atorvastatin (LIPITOR) 40 MG tablet Take 40 mg by mouth daily.     Marland Kitchen diltiazem (DILACOR XR) 180 MG 24 hr capsule Take 180 mg by mouth daily.    . fluticasone (FLONASE) 50 MCG/ACT nasal spray Place 2 sprays into both nostrils daily.  1  . furosemide (LASIX) 40 MG tablet Take 40 mg by mouth daily as needed. (swelling)  1  . lactose free nutrition (BOOST PLUS) LIQD Take 237 mLs by mouth 3 (three) times daily with meals.    Marland Kitchen lisinopril (PRINIVIL,ZESTRIL) 20 MG tablet Take 20 mg by mouth daily.    Marland Kitchen LORazepam (ATIVAN) 0.5 MG tablet Take 0.5-1 mg by mouth at bedtime as needed for sleep.    Marland Kitchen omeprazole (PRILOSEC) 40 MG capsule Take 40 mg by mouth daily.     . Vitamin D, Ergocalciferol, (DRISDOL) 50000 units CAPS capsule Take 50,000 Units by mouth once a  week.  1   No current facility-administered medications for this visit.    No Known Allergies  Social History   Social History  . Marital Status: Widowed    Spouse Name: N/A  . Number of Children: N/A  . Years of Education: N/A   Occupational History  . Not on file.   Social History Main Topics  . Smoking status: Never Smoker   . Smokeless tobacco: Never Used  . Alcohol Use: No  . Drug Use: No  . Sexual Activity: Not on file   Other Topics Concern  . Not on file   Social History Narrative     Review of Systems: General: negative for chills, fever, night sweats or weight changes.  Cardiovascular: negative for chest pain, dyspnea on exertion, edema, orthopnea, palpitations, paroxysmal nocturnal dyspnea or shortness of breath Dermatological: negative for rash Respiratory: negative for cough or wheezing Urologic: negative for hematuria Abdominal: negative for nausea, vomiting, diarrhea, bright red blood per rectum, melena, or hematemesis Neurologic: negative for visual changes, syncope, or dizziness All other systems reviewed and are otherwise negative except as noted above.    Blood pressure 142/60, pulse 48, height  (1.676 m), weight 232 lb 3.2 oz (105.325 kg).  General appearance: alert and no distress Neck: no adenopathy, no carotid bruit, no JVD, supple, symmetrical, trachea midline and thyroid not enlarged, symmetric, no tenderness/mass/nodules Lungs: clear to auscultation bilaterally Heart: regular rate  and rhythm, S1, S2 normal, no murmur, click, rub or gallop Extremities: extremities normal, atraumatic, no cyanosis or edema  EKG sinus bradycardia at 44 with evidence of left ventricle hypertrophy and nonspecific ST and T-wave changes. I personally reviewed this EKG  ASSESSMENT AND PLAN:   Essential hypertension History of hypertension with blood pressure measured today at 142/60. She is on atenolol, diltiazem and lisinopril. She is somewhat bradycardic  today with a heart rate in the 40s. I'm going to cut her atenolol in half from 25 mg a day to 12-1/2 and she will see Belenda CruiseKristin back in one month for follow-up of hypertension and bradycardia.  Hyperlipidemia History of hyperlipidemia on statin therapy followed by her PCP      Runell GessJonathan J. Eugine Bubb MD [facility] HospitalFACP,FACC,FAHA, [facility] CenterFSCAI 03/31/2015 10:08 AM

## 2015-03-31 NOTE — Assessment & Plan Note (Signed)
History of hyperlipidemia on statin therapy followed by her PCP. 

## 2015-03-31 NOTE — Patient Instructions (Signed)
Medication Instructions:  Your physician has recommended you make the following change in your medication:  1) DECREASE ATENOLOL TO 12.5 MG (1/2 TABLET) BY MOUTH DAILY    Labwork: NONE  Testing/Procedures: NONE  Follow-Up: Your physician recommends that you schedule a follow-up appointment in: 1 MONTH WITH [facility].  Your physician wants you to follow-up in: 12 MONTHS WITH DR Allyson SabalBERRY. You will receive a reminder letter in the mail two months in advance. If you don't receive a letter, please call our office to schedule the follow-up appointment.   Any Other Special Instructions Will Be Listed Below (If Applicable).     If you need a refill on your cardiac medications before your next appointment, please call your pharmacy.

## 2015-03-31 NOTE — Assessment & Plan Note (Signed)
History of hypertension with blood pressure measured today at 142/60. She is on atenolol, diltiazem and lisinopril. She is somewhat bradycardic today with a heart rate in the 40s. I'm going to cut her atenolol in half from 25 mg a day to 12-1/2 and she will see Belenda CruiseKristin back in one month for follow-up of hypertension and bradycardia.

## 2015-04-30 ENCOUNTER — Ambulatory Visit: Payer: Medicare Other | Admitting: Pharmacist Clinician (PhC)/ Clinical Pharmacy Specialist

## 2015-05-05 ENCOUNTER — Ambulatory Visit (INDEPENDENT_AMBULATORY_CARE_PROVIDER_SITE_OTHER): Payer: Medicare Other | Admitting: Pharmacist

## 2015-05-05 VITALS — BP 144/68 | HR 68 | Wt 232.0 lb

## 2015-05-05 DIAGNOSIS — I10 Essential (primary) hypertension: Secondary | ICD-10-CM

## 2015-05-05 NOTE — Assessment & Plan Note (Signed)
BP is at goal today in office.  Heart rate has improved, but patient did not take atenolol last night. Will have her take atenolol 12.5mg  every day and monitor her blood pressure at home. Instructed her to bring her cuff and her log to her next blood pressure check in 2 weeks.

## 2015-05-05 NOTE — Patient Instructions (Signed)
Return for a a follow up appointment in 2 weeks  Your blood pressure today is 144/68 and your pulse is 68.   Check your blood pressure at home daily (if able) and keep record of the readings.  Take your BP meds as follows: Atenolol start taking 1/2 tablet every day.  Bring all of your meds, your BP cuff and your record of home blood pressures to your next appointment.  Exercise as you're able, try to walk approximately 30 minutes per day.  Keep salt intake to a minimum, especially watch canned and prepared boxed foods.  Eat more fresh fruits and vegetables and fewer canned items.  Avoid eating in fast food restaurants.    HOW TO TAKE YOUR BLOOD PRESSURE: . Rest 5 minutes before taking your blood pressure. .  Don't smoke or drink caffeinated beverages for at least 30 minutes before. . Take your blood pressure before (not after) you eat. . Sit comfortably with your back supported and both feet on the floor (don't cross your legs). . Elevate your arm to heart level on a table or a desk. . Use the proper sized cuff. It should fit smoothly and snugly around your bare upper arm. There should be enough room to slip a fingertip under the cuff. The bottom edge of the cuff should be 1 inch above the crease of the elbow. . Ideally, take 3 measurements at one sitting and record the average.

## 2015-05-05 NOTE — Progress Notes (Signed)
Patient ID: Whitney ShiStella A Pietro                 DOB: 10/11/1931                      MRN: [id number]     HPI: Whitney [name] is a 82 y.o. female referred by Dr. Allyson SabalBerry to HTN clinic for evaluation of blood pressure and heart rate after a recent decrease in her atenolol dose. She states she has been feeling fine since the changes.   She has been taking atenolol 1/2 tablet about twice a week instead of every day. She also has been monitoring her blood pressure at home and thought that the bottom number on that reading was her pulse. She says those readings have been around 140/70 and she has not paid attention to the heart rate since she thought the bottom number was the heart rate.   She did not take atenolol last night.   Current HTN meds: she takes her medications at night Atenolol 12.5mg  - patient has been taking this medication about 2 times a week instead of every day  Diltiazem 180mg  daily Furosemide 40mg  daily if needed for swelling - using 3 times a week - she also states she takes a potassium tablet when she takes the furosemide Lisinopril 20mg  daily  Previously tried:  Atenolol 25mg  daily - decreased due to bradycardia BP goal: <150/90  Diet: Eats mostly at home rarely using salt   Exercise: does not exercise, but does work in the yard   Home BP readings:   Has monitor but did not bring it or log today  Wt Readings from Last 3 Encounters:  03/31/15 232 lb 3.2 oz (105.325 kg)  11/22/13 211 lb (95.709 kg)  11/19/13 211 lb (95.709 kg)   BP Readings from Last 3 Encounters:  03/31/15 142/60  11/19/13 160/72  03/10/13 157/73   Pulse Readings from Last 3 Encounters:  03/31/15 48  11/19/13 68  03/10/13 71    Renal function: CrCl cannot be calculated (Unknown ideal weight.).  Past Medical History  Diagnosis Date  . Hyperlipidemia   . Hypertension   . Thrombocytopenia (HCC)   . Lactic acidosis   . Abnormal EKG   . Transaminitis     Current Outpatient  Prescriptions on File Prior to Visit  Medication Sig Dispense Refill  . atenolol (TENORMIN) 25 MG tablet Take 0.5 tablets (12.5 mg total) by mouth daily. 90 tablet 3  . atorvastatin (LIPITOR) 40 MG tablet Take 40 mg by mouth daily.     Marland Kitchen. diltiazem (DILACOR XR) 180 MG 24 hr capsule Take 180 mg by mouth daily.    . fluticasone (FLONASE) 50 MCG/ACT nasal spray Place 2 sprays into both nostrils daily.  1  . furosemide (LASIX) 40 MG tablet Take 40 mg by mouth daily as needed. (swelling)  1  . lactose free nutrition (BOOST PLUS) LIQD Take 237 mLs by mouth 3 (three) times daily with meals.    Marland Kitchen. lisinopril (PRINIVIL,ZESTRIL) 20 MG tablet Take 20 mg by mouth daily.    Marland Kitchen. LORazepam (ATIVAN) 0.5 MG tablet Take 0.5-1 mg by mouth at bedtime as needed for sleep.    Marland Kitchen. omeprazole (PRILOSEC) 40 MG capsule Take 40 mg by mouth daily.     . Vitamin D, Ergocalciferol, (DRISDOL) 50000 units CAPS capsule Take 50,000 Units by mouth once a week.  1   No current facility-administered medications on file prior to visit.  No Known Allergies   Assessment/Plan: Hypertension: BP is at goal today in office.  Heart rate has improved, but patient did not take atenolol last night. Will have her take atenolol 12.5mg  every day and monitor her blood pressure at home. Instructed her to bring her cuff and her log to her next blood pressure check in 2 weeks.   Thank you, Freddie Apley. Cleatis Polka, PharmD  [facility]  05/05/2015 1:35 PM

## 2015-05-19 ENCOUNTER — Ambulatory Visit: Payer: Medicare Other | Admitting: Pharmacist Clinician (PhC)/ Clinical Pharmacy Specialist

## 2015-05-20 ENCOUNTER — Ambulatory Visit: Payer: Medicare Other | Admitting: Pharmacist Clinician (PhC)/ Clinical Pharmacy Specialist

## 2015-05-20 ENCOUNTER — Encounter: Payer: Self-pay | Admitting: Pharmacist Clinician (PhC)/ Clinical Pharmacy Specialist

## 2015-05-20 ENCOUNTER — Ambulatory Visit (INDEPENDENT_AMBULATORY_CARE_PROVIDER_SITE_OTHER): Payer: Medicare Other | Admitting: Pharmacist Clinician (PhC)/ Clinical Pharmacy Specialist

## 2015-05-20 VITALS — BP 160/68 | HR 56 | Ht 65.0 in | Wt 230.2 lb

## 2015-05-20 DIAGNOSIS — I10 Essential (primary) hypertension: Secondary | ICD-10-CM

## 2015-05-20 MED ORDER — LISINOPRIL 20 MG PO TABS: 30.0000 mg | ORAL_TABLET | Freq: Every day | ORAL | Status: AC

## 2015-05-20 NOTE — Assessment & Plan Note (Signed)
Today BP elevated in office at 160/68.  Her home cuff read about 15 points higher, maybe due to cuff being too small for her upper arm.  Will increase lisinopril to 30 mg daily and have asked that she take it at bedtime, as she currently takes all BP medications in the mornings.   Will follow up with her in 6 weeks.

## 2015-05-20 NOTE — Progress Notes (Signed)
Patient ID: Whitney ShiStella A [name]                 DOB: 03/16/1931                      MRN: [id number]     HPI: Whitney [name] is a 82 y.o. female referred by Dr. Allyson SabalBerry to HTN clinic for evaluation of blood pressure and heart rate after a recent decrease in her atenolol dose. She states she has been feeling fine since the changes.   She saw [name] two weeks ago, showing an elevated pressure of 148/68.  At that time she was only taking her atenolol 12.5 mg about twice weekly.  Since then she states compliant, taking all of her medications every morning.  Today she comes in with her home BP cuff and readings from most days since her last visit  Current HTN meds: she takes her medications each morning Atenolol 12.5mg  - daily Diltiazem 180mg  - daily Furosemide 40mg  - daily if needed for swelling - using 3 times a week - she also states she takes a potassium tablet when she takes the furosemide Lisinopril 20mg  - daily  Previously tried:  Atenolol 25mg  daily - caused bradycardia  Family history: father died from stroke at age 80  Social History:  Never smoked, no alcohol; drinks coffee each morning, as well as some caffeinated sodaa  BP goal: <150/90  Diet: Eats mostly at home rarely using salt   Exercise: does not exercise, but does work in the yard   Home BP readings: Took most mornings since last visit - of 13 readings 5 were 150 or greater remainder were mostly high 140's;  Average 151/74;  Home cuff is [employer] and read 15 points higher - cuff is too small for her arm    Wt Readings from Last 3 Encounters:  05/20/15 230 lb 3.2 oz (104.418 kg)  05/05/15 232 lb (105.235 kg)  03/31/15 232 lb 3.2 oz (105.325 kg)   BP Readings from Last 3 Encounters:  05/20/15 160/68  05/05/15 144/68  03/31/15 142/60   Pulse Readings from Last 3 Encounters:  05/20/15 56  05/05/15 68  03/31/15 48    Renal function: Last GFR was in 2015, showed > 60  Past Medical History    Diagnosis Date  . Hyperlipidemia   . Hypertension   . Thrombocytopenia (HCC)   . Lactic acidosis   . Abnormal EKG   . Transaminitis     Current Outpatient Prescriptions on File Prior to Visit  Medication Sig Dispense Refill  . atenolol (TENORMIN) 25 MG tablet Take 0.5 tablets (12.5 mg total) by mouth daily. 90 tablet 3  . atorvastatin (LIPITOR) 40 MG tablet Take 40 mg by mouth daily.     Marland Kitchen. diltiazem (DILACOR XR) 180 MG 24 hr capsule Take 180 mg by mouth daily.    . fluticasone (FLONASE) 50 MCG/ACT nasal spray Place 2 sprays into both nostrils daily.  1  . furosemide (LASIX) 40 MG tablet Take 40 mg by mouth daily as needed. (swelling)  1  . lactose free nutrition (BOOST PLUS) LIQD Take 237 mLs by mouth 3 (three) times daily with meals.    Marland Kitchen. lisinopril (PRINIVIL,ZESTRIL) 20 MG tablet Take 20 mg by mouth daily.    Marland Kitchen. LORazepam (ATIVAN) 0.5 MG tablet Take 0.5-1 mg by mouth at bedtime as needed for sleep. Reported on 05/05/2015    . omeprazole (PRILOSEC) 40 MG capsule Take 40 mg  by mouth daily.     . Vitamin D, Ergocalciferol, (DRISDOL) 50000 units CAPS capsule Take 50,000 Units by mouth once a week.  1   No current facility-administered medications on file prior to visit.    No Known Allergies  Phillips Hay PharmD CPP [facility]

## 2015-05-20 NOTE — Patient Instructions (Signed)
Return for a a follow up appointment in 6 wks  Your blood pressure today is 160/68  (gial is < 150/90)   Check your blood pressure at home several days each week and keep record of the readings.  Ask your primary care MD to send a copy of any lab work you have done in June  Take your BP meds as follows: increase lisinopril to 30 mg (1.5 tablets) daily; continue all other medications  Bring all of your meds, your BP cuff and your record of home blood pressures to your next appointment.  Exercise as you're able, try to walk approximately 30 minutes per day.  Keep salt intake to a minimum, especially watch canned and prepared boxed foods.  Eat more fresh fruits and vegetables and fewer canned items.  Avoid eating in fast food restaurants.    HOW TO TAKE YOUR BLOOD PRESSURE: . Rest 5 minutes before taking your blood pressure. .  Don't smoke or drink caffeinated beverages for at least 30 minutes before. . Take your blood pressure before (not after) you eat. . Sit comfortably with your back supported and both feet on the floor (don't cross your legs). . Elevate your arm to heart level on a table or a desk. . Use the proper sized cuff. It should fit smoothly and snugly around your bare upper arm. There should be enough room to slip a fingertip under the cuff. The bottom edge of the cuff should be 1 inch above the crease of the elbow. . Ideally, take 3 measurements at one sitting and record the average.

## 2015-06-30 ENCOUNTER — Ambulatory Visit: Payer: Medicare Other

## 2015-07-08 ENCOUNTER — Ambulatory Visit: Payer: Medicare Other

## 2015-12-30 ENCOUNTER — Other Ambulatory Visit (HOSPITAL_COMMUNITY): Payer: Self-pay | Admitting: Family Medicine

## 2015-12-30 DIAGNOSIS — Z1231 Encounter for screening mammogram for malignant neoplasm of breast: Secondary | ICD-10-CM

## 2016-01-13 ENCOUNTER — Ambulatory Visit (HOSPITAL_COMMUNITY): Payer: Medicare Other

## 2016-01-14 ENCOUNTER — Ambulatory Visit (HOSPITAL_COMMUNITY)
Admission: RE | Admit: 2016-01-14 | Discharge: 2016-01-14 | Disposition: A | Discharge status: Home / Self Care | Payer: Medicare Other | Source: Ambulatory Visit | Attending: Family Medicine | Admitting: Family Medicine

## 2016-01-14 DIAGNOSIS — Z1231 Encounter for screening mammogram for malignant neoplasm of breast: Secondary | ICD-10-CM | POA: Insufficient documentation

## 2016-05-06 ENCOUNTER — Ambulatory Visit (INDEPENDENT_AMBULATORY_CARE_PROVIDER_SITE_OTHER): Payer: Medicare Other | Admitting: Cardiovascular Disease

## 2016-05-06 ENCOUNTER — Encounter: Payer: Self-pay | Admitting: Cardiovascular Disease

## 2016-05-06 DIAGNOSIS — E7800 Pure hypercholesterolemia, unspecified: Secondary | ICD-10-CM

## 2016-05-06 DIAGNOSIS — I10 Essential (primary) hypertension: Secondary | ICD-10-CM

## 2016-05-06 DIAGNOSIS — R001 Bradycardia, unspecified: Secondary | ICD-10-CM

## 2016-05-06 NOTE — Patient Instructions (Signed)
Medication Instructions: Your physician recommends that you continue on your current medications as directed. Please refer to the Current Medication list given to you today.   Follow-Up: Your physician recommends that you schedule a follow-up appointment as needed with Dr. Berry.    

## 2016-05-06 NOTE — Assessment & Plan Note (Signed)
Asymptomatic sinus bradycardia on diltiazem and atenolol for hypertension.

## 2016-05-06 NOTE — Assessment & Plan Note (Signed)
History of hypertension with blood pressure measured to 190/78. Her daughter says she was [place]. Her blood pressure measured home is in the 160/70 range. She is on diltiazem, atenolol and lisinopril. She is somewhat bradycardic with a heart rate in the 40s although she is a symptomatic from this. Continue current meds at current dosing

## 2016-05-06 NOTE — Progress Notes (Signed)
05/06/2016 Whitney [name]   1931/05/10  [id number]  Primary Physician Milana Obey, MD Primary Cardiologist: Runell Gess MD Roseanne Reno  HPI:  The patient is a 83 year old moderately-overweight widowed African American female, mother of 7 and grandmother to 10 grandchildren who I last saw 03/31/15. She was referred by Dr. Sudie Bailey every 12 for evaluation of chest pain at that time. She lives with her daughter and son. Her risk factors include treated hypertension and hyperlipidemia. She does not smoke or drink, nor is there a family history. She has never had a heart attack or stroke. Her past surgical history is remarkable for a remote hysterectomy. Over the last 3 weeks she has noticed substernal chest pressure, which has occurred approximately 3 times, associated with weakness. There is some radiation to her left shoulder but no shortness of breath. She had a Myoview stress test performed 04/03/12 which is entirely normal. She's had no recurrent symptoms since I saw her last.   Current Outpatient Prescriptions  Medication Sig Dispense Refill  . atenolol (TENORMIN) 25 MG tablet Take 0.5 tablets (12.5 mg total) by mouth daily. 90 tablet 3  . atorvastatin (LIPITOR) 40 MG tablet Take 40 mg by mouth daily.     Marland Kitchen diltiazem (DILACOR XR) 180 MG 24 hr capsule Take 180 mg by mouth daily.    . fluticasone (FLONASE) 50 MCG/ACT nasal spray Place 2 sprays into both nostrils daily.  1  . furosemide (LASIX) 40 MG tablet Take 40 mg by mouth daily as needed. (swelling)  1  . lactose free nutrition (BOOST PLUS) LIQD Take 237 mLs by mouth 3 (three) times daily with meals.    Marland Kitchen lisinopril (PRINIVIL,ZESTRIL) 20 MG tablet Take 1.5 tablets (30 mg total) by mouth daily. 135 tablet 2  . LORazepam (ATIVAN) 0.5 MG tablet Take 0.5-1 mg by mouth at bedtime as needed for sleep. Reported on 05/05/2015    . omeprazole (PRILOSEC) 40 MG capsule Take 40 mg by mouth daily.     . Vitamin D,  Ergocalciferol, (DRISDOL) 50000 units CAPS capsule Take 50,000 Units by mouth once a week.  1   No current facility-administered medications for this visit.     No Known Allergies  Social History   Social History  . Marital status: Widowed    Spouse name: N/A  . Number of children: N/A  . Years of education: N/A   Occupational History  . Not on file.   Social History Main Topics  . Smoking status: Never Smoker  . Smokeless tobacco: Never Used  . Alcohol use No  . Drug use: No  . Sexual activity: Not on file   Other Topics Concern  . Not on file   Social History Narrative  . No narrative on file     Review of Systems: General: negative for chills, fever, night sweats or weight changes.  Cardiovascular: negative for chest pain, dyspnea on exertion, edema, orthopnea, palpitations, paroxysmal nocturnal dyspnea or shortness of breath Dermatological: negative for rash Respiratory: negative for cough or wheezing Urologic: negative for hematuria Abdominal: negative for nausea, vomiting, diarrhea, bright red blood per rectum, melena, or hematemesis Neurologic: negative for visual changes, syncope, or dizziness All other systems reviewed and are otherwise negative except as noted above.    Blood pressure (!) 190/78, pulse (!) 43, height 5\' 5"  (1.651 m), weight 227 lb 6.4 oz (103.1 kg).  General appearance: alert and no distress Neck: no adenopathy, no carotid bruit,  no JVD, supple, symmetrical, trachea midline and thyroid not enlarged, symmetric, no tenderness/mass/nodules Lungs: clear to auscultation bilaterally Heart: regular rate and rhythm, S1, S2 normal, no murmur, click, rub or gallop Extremities: Mild ankle edema bilaterally  EKG sinus bradycardia 43 with LVH voltage. I personally reviewed this EKG.  ASSESSMENT AND PLAN:   Sinus bradycardia Asymptomatic sinus bradycardia on diltiazem and atenolol for hypertension.  Essential hypertension History of hypertension  with blood pressure measured to 190/78. Her daughter says she was anxious during the drive in traffic from [place]. Her blood pressure measured home is in the 160/70 range. She is on diltiazem, atenolol and lisinopril. She is somewhat bradycardic with a heart rate in the 40s although she is a symptomatic from this. Continue current meds at current dosing  Hyperlipidemia History of hyperlipidemia on statin therapy followed by her PCP      Runell GessJonathan J. Quaneisha Hanisch MD [facility] CenterFACP,FACC,FAHA, [facility] 05/06/2016 9:52 AM

## 2016-05-06 NOTE — Assessment & Plan Note (Signed)
History of hyperlipidemia on statin therapy followed by her PCP. 

## 2016-05-11 NOTE — Addendum Note (Signed)
Addended by: Chana BodeGREEN, Osmel Dykstra L on: 05/11/2016 04:43 PM   Modules accepted: Orders

## 2016-05-18 IMAGING — MR MR ABDOMEN WO/W CM
9 of 18 series · 21 of 48 positions shown · IV contrast (multihance)
Comparison: Multiple exams, including 03/05/2013

CLINICAL DATA: Hepatic subcapsular hematoma

EXAM:
MRI ABDOMEN WITHOUT AND WITH CONTRAST
TECHNIQUE: Multiplanar multisequence MR imaging of the abdomen was performed
both before and after the administration of intravenous contrast.
CONTRAST:  20mL MULTIHANCE GADOBENATE DIMEGLUMINE 529 MG/ML IV SOLN

[Series 2: t2fs axial blade · axial · 4.0mm · 1.16mm/px · 1 of 48 slices shown]
[im 1/48]
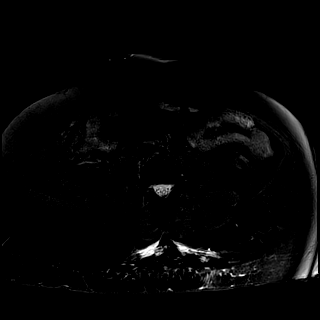

[Series 4: T2 · coronal · 5.0mm · 1.08mm/px · 1 of 40 slices shown]
[im 1/40]
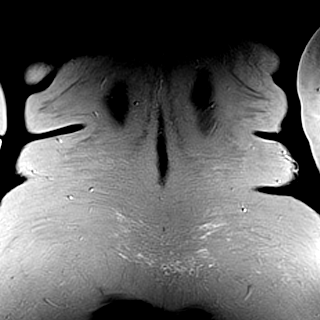

[Series 5: DWI · axial · 5.0mm · 0.99mm/px · z∈[-59,+175]mm · 3 of 80 slices shown (1 of 2)]
[im 1/80]
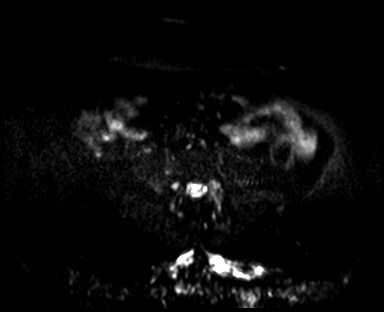
[im 40/80]
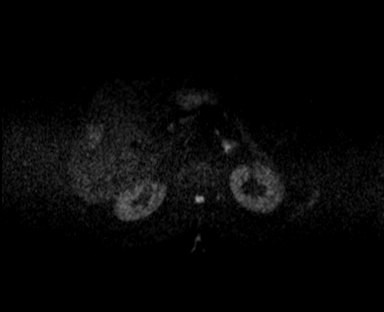
[im 80/80]
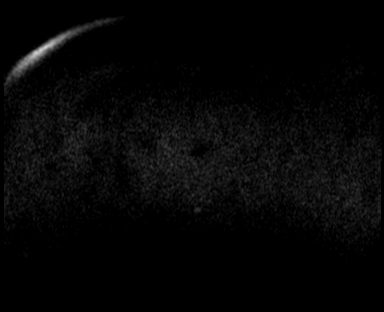

[Series 6: DWI · axial · 5.0mm · 0.99mm/px · z∈[-59,+175]mm · 2 of 40 slices shown (2 of 2)]
[im 1/40]
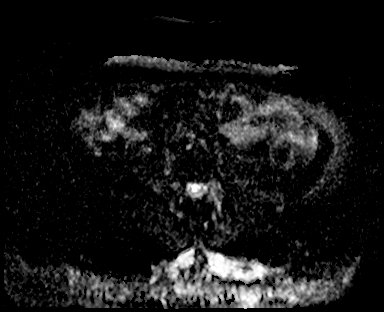
[im 40/40]
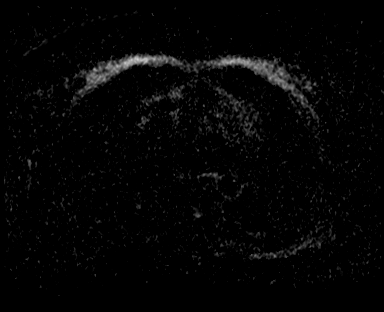

[Series 7: bSSFP · axial · 4.0mm · 0.68mm/px · z∈[-88,+188]mm · 3 of 70 slices shown]
[im 1/70]
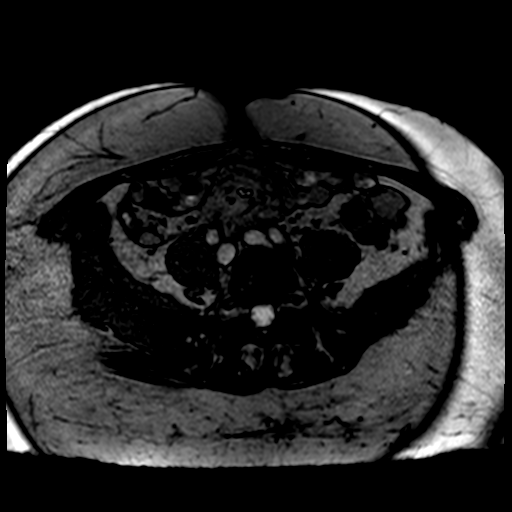
[im 35/70]
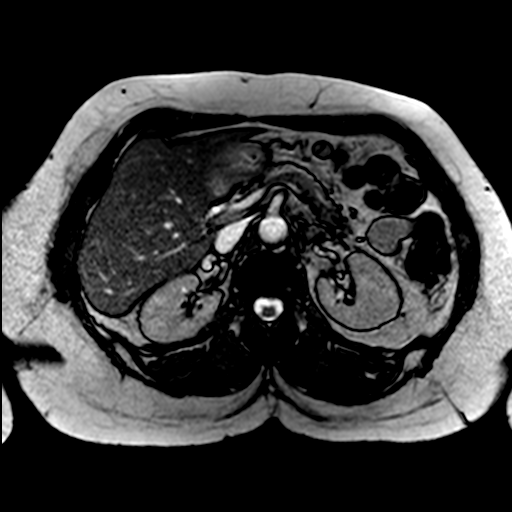
[im 70/70]
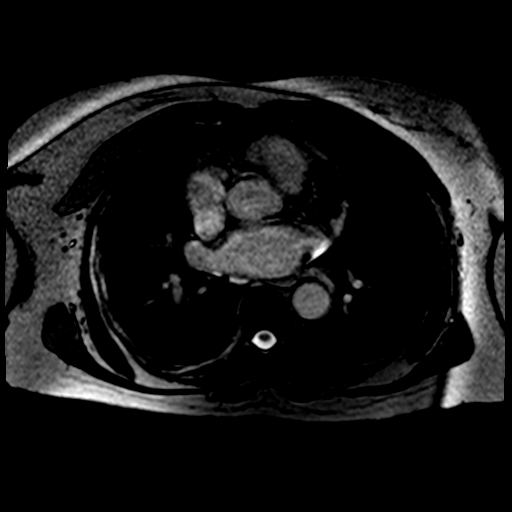

[Series 9: t2fs axial · axial · 5.0mm · 1.19mm/px · z∈[-64,+170]mm · 2 of 40 slices shown]
[im 1/40]
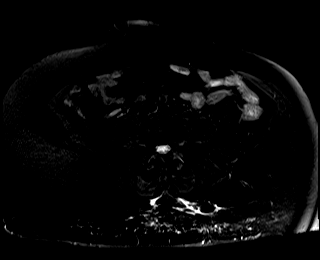
[im 40/40]
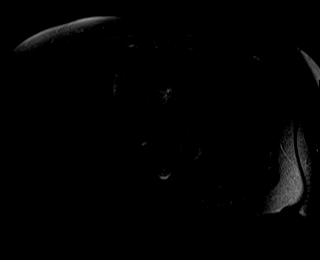

[Series 10: T1 · axial · 4.0mm · 0.59mm/px · z∈[-73,+179]mm · 3 of 64 slices shown (1 of 3)]
[im 1/64]
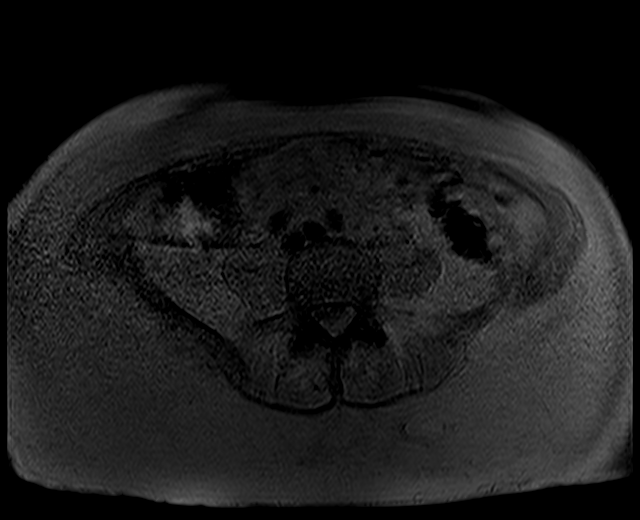
[im 32/64]
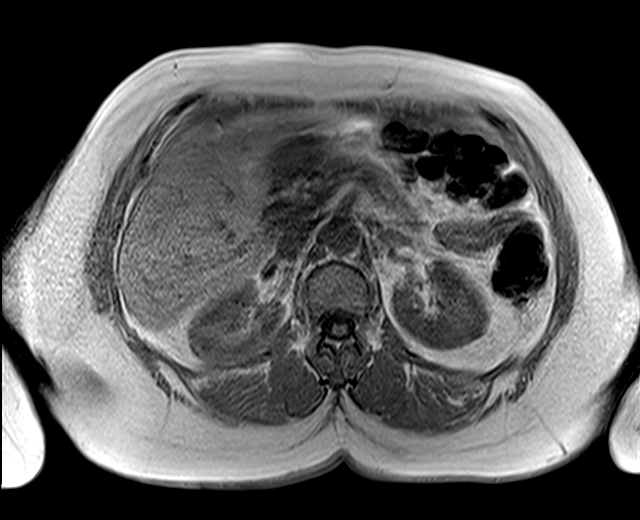
[im 64/64]
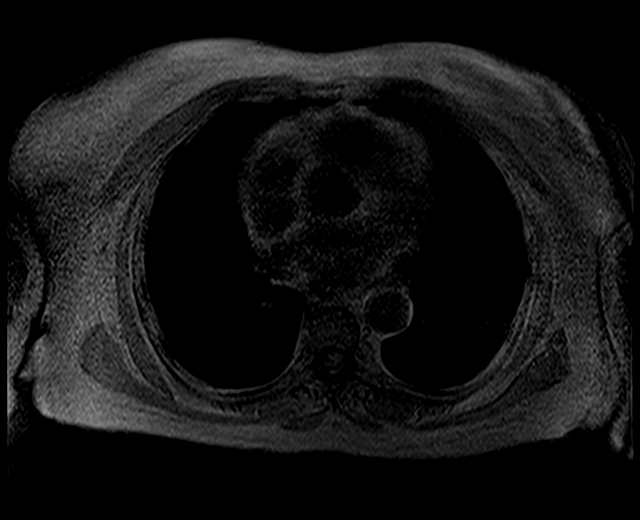

[Series 11: T1 · axial · 4.0mm · 0.59mm/px · z∈[-73,+179]mm · 3 of 64 slices shown (2 of 3)]
[im 1/64]
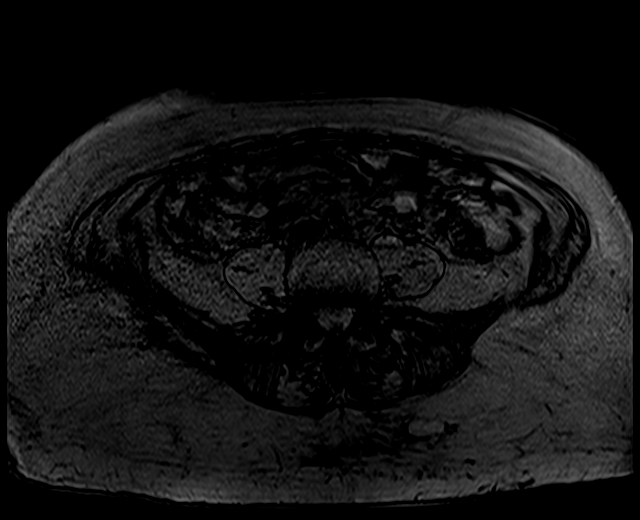
[im 32/64]
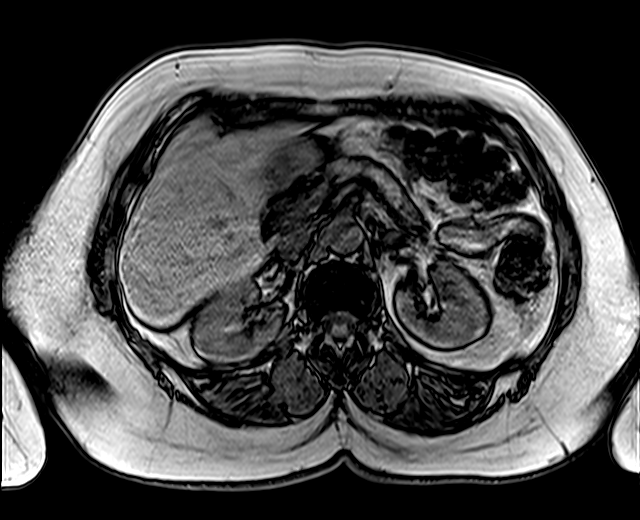
[im 64/64]
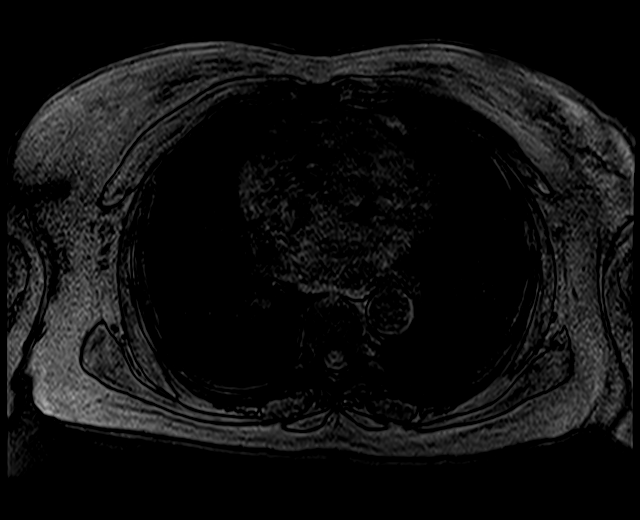

[Series 13: T1 · axial · 4.0mm · 0.59mm/px · z∈[-73,+179]mm · 3 of 64 slices shown (3 of 3)]
[im 1/64]
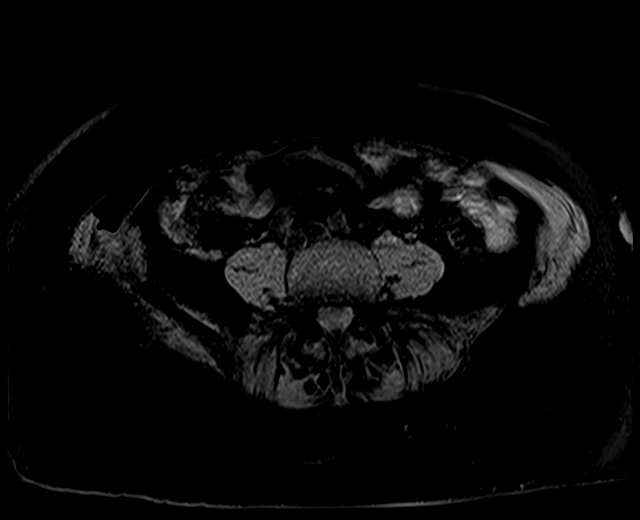
[im 32/64]
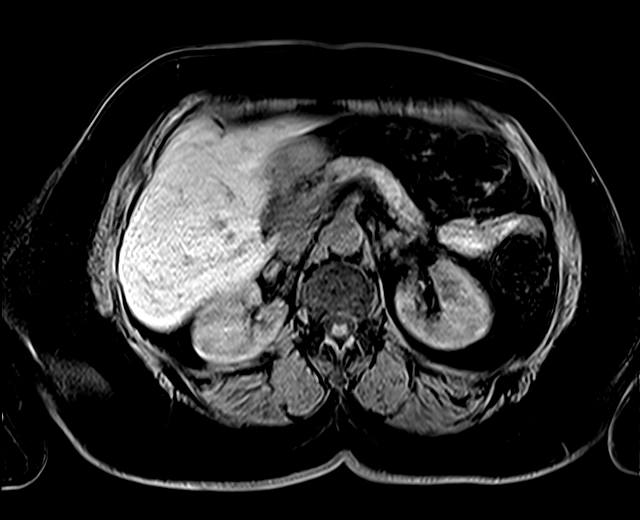
[im 64/64]
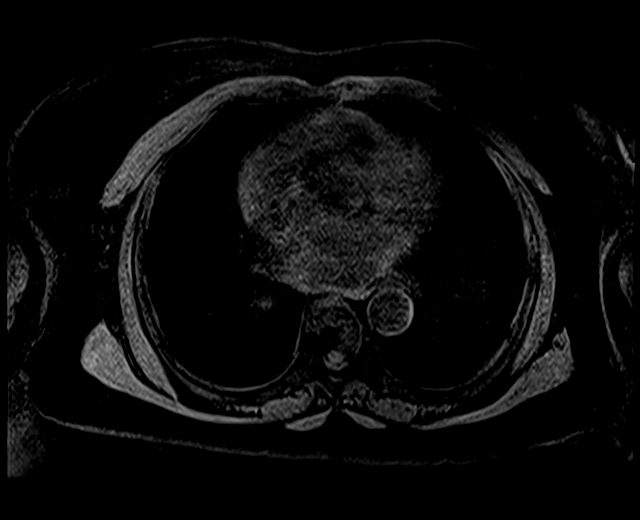

[21 of 48 positions shown; findings below may reference images not displayed]

FINDINGS: Lower chest:  Prior right pleural effusion has resolved.

Hepatobiliary: Evolutionary changes in the right subcapsular
hematoma with dramatic reduction in size. There is an oval-shaped
residual component measuring 4.9 by 2.8 by 4.6 cm along the right
hepatic lobe on image 16 of series 27 with high a gradient and T1
signal characteristics and mixed but primarily high T2 signal
characteristics and with a rim of low signal intensity hemosiderin.
This collection previously measured 12.3 by 8.5 by 12.4 cm, so it
has dramatically reduced in size. The central high T1 signal likely
represents residual proteinaceous contents. The part of the
subcapsular hematoma that tract above the right hepatic lobe No
manifest mostly as a sheet of low signal intensity compatible with
hemosiderin and evolutionary findings. No abnormal associated
enhancement. No new abnormality is observed. No abnormal extension
of this process is identified.

A gallstone measures 3.6 cm in long axis.  No biliary dilatation.

Pancreas: Unremarkable

Spleen: Unremarkable

Adrenals/Urinary Tract: Unremarkable

Stomach/Bowel: Periampullary duodenal diverticulum measures up to
3.3 cm in long axis.

Vascular/Lymphatic: Abdominal aortic atherosclerotic plaque.

Musculoskeletal: Unremarkable
IMPRESSION: 1. Significant reduction in volume along with evolutionary findings
in the right hepatic lobe subcapsular hematoma. The continues to be
in the oval-shaped component along the right lateral margin of the
liver, dramatically reduced in size but currently measuring 4.9 by
2.8 by 4.6 cm and containing proteinaceous material based on signal.
No enhancement. Hemosiderin represented give of the prior hemorrhage
also tracks along the upper margin of the liver.
2. Single large gallstone in the gallbladder without gallbladder
wall thickening.
3. Periampullary duodenal diverticulum.
4. Atherosclerosis.

## 2018-04-30 ENCOUNTER — Encounter (HOSPITAL_COMMUNITY): Payer: Self-pay

## 2018-04-30 ENCOUNTER — Emergency Department (HOSPITAL_COMMUNITY): Payer: Medicare Other

## 2018-04-30 ENCOUNTER — Emergency Department (HOSPITAL_COMMUNITY)
Admission: EM | Admit: 2018-04-30 | Discharge: 2018-04-30 | Disposition: A | Discharge status: Home / Self Care | Payer: Medicare Other | Attending: Emergency Medicine | Admitting: Emergency Medicine

## 2018-04-30 ENCOUNTER — Other Ambulatory Visit: Payer: Self-pay

## 2018-04-30 DIAGNOSIS — E785 Hyperlipidemia, unspecified: Secondary | ICD-10-CM | POA: Insufficient documentation

## 2018-04-30 DIAGNOSIS — J1282 Pneumonia due to coronavirus disease 2019: Secondary | ICD-10-CM

## 2018-04-30 DIAGNOSIS — Z79899 Other long term (current) drug therapy: Secondary | ICD-10-CM | POA: Insufficient documentation

## 2018-04-30 DIAGNOSIS — I10 Essential (primary) hypertension: Secondary | ICD-10-CM | POA: Diagnosis not present

## 2018-04-30 DIAGNOSIS — J1289 Other viral pneumonia: Secondary | ICD-10-CM | POA: Diagnosis not present

## 2018-04-30 DIAGNOSIS — R531 Weakness: Secondary | ICD-10-CM | POA: Diagnosis present

## 2018-04-30 LAB — URINALYSIS, ROUTINE W REFLEX MICROSCOPIC
Glucose, UA: NEGATIVE mg/dL
Hgb urine dipstick: NEGATIVE
Ketones, ur: NEGATIVE mg/dL
Leukocytes,Ua: NEGATIVE
Nitrite: NEGATIVE
Protein, ur: 30 mg/dL — AB
Specific Gravity, Urine: 1.03 (ref 1.005–1.030)
pH: 6 (ref 5.0–8.0)

## 2018-04-30 LAB — CBC WITH DIFFERENTIAL/PLATELET
Abs Immature Granulocytes: 0.41 10*3/uL — ABNORMAL HIGH (ref 0.00–0.07)
Basophils Absolute: 0.1 10*3/uL (ref 0.0–0.1)
Basophils Relative: 1 %
Eosinophils Absolute: 0.1 10*3/uL (ref 0.0–0.5)
Eosinophils Relative: 1 %
HCT: 41.2 % (ref 36.0–46.0)
Hemoglobin: 13.2 g/dL (ref 12.0–15.0)
Immature Granulocytes: 4 %
Lymphocytes Relative: 19 %
Lymphs Abs: 1.7 10*3/uL (ref 0.7–4.0)
MCH: 30.9 pg (ref 26.0–34.0)
MCHC: 32 g/dL (ref 30.0–36.0)
MCV: 96.5 fL (ref 80.0–100.0)
Monocytes Absolute: 0.9 10*3/uL (ref 0.1–1.0)
Monocytes Relative: 10 %
Neutro Abs: 6 10*3/uL (ref 1.7–7.7)
Neutrophils Relative %: 65 %
Platelets: 223 10*3/uL (ref 150–400)
RBC: 4.27 MIL/uL (ref 3.87–5.11)
RDW: 13.5 % (ref 11.5–15.5)
WBC: 9.2 10*3/uL (ref 4.0–10.5)
nRBC: 0 % (ref 0.0–0.2)

## 2018-04-30 LAB — COMPREHENSIVE METABOLIC PANEL
ALT: 26 U/L (ref 0–44)
AST: 29 U/L (ref 15–41)
Albumin: 3.5 g/dL (ref 3.5–5.0)
Alkaline Phosphatase: 64 U/L (ref 38–126)
Anion gap: 10 (ref 5–15)
BUN: 20 mg/dL (ref 8–23)
CO2: 22 mmol/L (ref 22–32)
Calcium: 9 mg/dL (ref 8.9–10.3)
Chloride: 106 mmol/L (ref 98–111)
Creatinine, Ser: 0.83 mg/dL (ref 0.44–1.00)
GFR calc Af Amer: >60 mL/min (ref >60)
GFR calc non Af Amer: >60 mL/min (ref >60)
Glucose, Bld: 117 mg/dL — ABNORMAL HIGH (ref 70–99)
Potassium: 4.3 mmol/L (ref 3.5–5.1)
Sodium: 138 mmol/L (ref 135–145)
Total Bilirubin: 0.4 mg/dL (ref 0.3–1.2)
Total Protein: 6.7 g/dL (ref 6.5–8.1)

## 2018-04-30 LAB — SARS CORONAVIRUS 2 BY RT PCR (HOSPITAL ORDER, PERFORMED IN ~~LOC~~ HOSPITAL LAB): SARS Coronavirus 2: POSITIVE — AB

## 2018-04-30 LAB — MAGNESIUM: Magnesium: 2.1 mg/dL (ref 1.7–2.4)

## 2018-04-30 LAB — TROPONIN I: Troponin I: <0.03 ng/mL (ref <0.03)

## 2018-04-30 MED ORDER — AMOXICILLIN-POT CLAVULANATE 875-125 MG PO TABS
1.0000 | ORAL_TABLET | Freq: Once | ORAL | Status: AC
  Administered 2018-04-30: 1 via ORAL
  Filled 2018-04-30: qty 1

## 2018-04-30 MED ORDER — IOHEXOL 350 MG/ML SOLN
100.0000 mL | Freq: Once | INTRAVENOUS | Status: AC | PRN
  Administered 2018-04-30: 17:00:00 100 mL via INTRAVENOUS

## 2018-04-30 NOTE — ED Notes (Signed)
[id number] discharge papers reviewed with pt and family

## 2018-04-30 NOTE — ED Provider Notes (Signed)
[facility] EMERGENCY DEPARTMENT Provider Note   CSN: [id number] Arrival date & time: 04/30/18  1327    History   Chief Complaint Chief Complaint  Patient presents with  . Shortness of Breath    HPI Whitney [name] is a 85 y.o. female.     HPI  85 y/o female - she presents after having had weakness for a couple of days - started 2 days ago - described as having no energy - non focal - worse with trying to walk, better with rest - has some vague SOB but no cough, no fever, no n/v/d and no rash / swelling / numbness / visual changes, headache or any other sx - she did go up on her BP medicine a couple of weeks ago - no other recent changes - she is still walking but feels weak.   Sx are persistent, not changing, worse with exertion but constant  Past Medical History:  Diagnosis Date  . Abnormal EKG   . Hyperlipidemia   . Hypertension   . Lactic acidosis   . Thrombocytopenia (HCC)   . Transaminitis     Patient Active Problem List   Diagnosis Date Noted  . Sinus bradycardia 05/06/2016  . Nausea & vomiting 03/05/2013  . Vomiting 03/04/2013  . Thrombocytopenia, unspecified (HCC) 01/22/2013  . Subcapsular hemorrhage of liver 01/22/2013  . Acute renal failure (HCC) 01/22/2013  . Positive blood culture 01/22/2013  . Hypotension 01/21/2013  . Transaminitis 01/21/2013  . Nausea with vomiting 01/21/2013  . Abdominal pain, right upper quadrant 01/21/2013  . abnormal electrocardiogram (EKG) 01/21/2013  . Anemia, unspecified 01/21/2013  . Lactic acidosis 01/21/2013  . Essential hypertension 10/26/2012  . Hyperlipidemia 10/26/2012  . DIVERTICULITIS OF COLON 05/25/2009  . HIP PAIN 10/31/2007  . BACK PAIN 10/31/2007    Past Surgical History:  Procedure Laterality Date  . ABDOMINAL HYSTERECTOMY    . CARDIOVASCULAR STRESS TEST  04/03/2012   Normal study. No evidence of ischemia.  . COLONOSCOPY  02/2009   diverticulosis, 3 polyps (tubular adenoma)  .  ESOPHAGOGASTRODUODENOSCOPY N/A 03/07/2013   WUJ:WJXBJYN and ulcerative reflux esophagitis.Noncritical Schatzki's ring. Hiatal herniaInflamed-appearinggastric mucosa with submucosal edema extending down into the antrumthrough the pyloric channel  . ESOPHAGOGASTRODUODENOSCOPY  02/2009   gastritis  . GIVENS CAPSULE STUDY  02/2009   negative     OB History    Gravida  8   Para  8   Term      Preterm  8   AB      Living  7     SAB      TAB      Ectopic      Multiple      Live Births               Home Medications    Prior to Admission medications   Medication Sig Start Date End Date Taking? Authorizing Provider  atenolol (TENORMIN) 25 MG tablet Take 0.5 tablets (12.5 mg total) by mouth daily. Patient taking differently: Take 37.5 mg by mouth daily.  03/31/15  Yes Runell Gess, MD  atorvastatin (LIPITOR) 40 MG tablet Take 40 mg by mouth daily.    Yes [provider]  diltiazem (DILACOR XR) 240 MG 24 hr capsule Take 240 mg by mouth daily.    Yes [provider]  Ensure (ENSURE) Take 237 mLs by mouth 2 (two) times daily between meals.   Yes [provider]  fluticasone (FLONASE) 50 MCG/ACT nasal  spray Place 2 sprays into both nostrils 2 (two) times a day.  03/05/15  Yes [provider]  furosemide (LASIX) 40 MG tablet Take 40 mg by mouth daily as needed. (swelling) 03/14/15  Yes [provider]  hydrochlorothiazide (HYDRODIURIL) 25 MG tablet Take 25 mg by mouth daily.   Yes [provider]  lisinopril (PRINIVIL,ZESTRIL) 20 MG tablet Take 1.5 tablets (30 mg total) by mouth daily. 05/20/15  Yes Runell Gess, MD  vitamin B-12 (CYANOCOBALAMIN) 500 MCG tablet Take 500 mcg by mouth daily.   Yes [provider]  Vitamin D, Ergocalciferol, (DRISDOL) 50000 units CAPS capsule Take 50,000 Units by mouth once a week. 03/14/15  Yes [provider]    Family History Family History  Problem Relation Age of Onset   . Healthy Mother   . Healthy Father   . Healthy Sister   . Diabetes Child   . Colon cancer Neg Hx     Social History Social History   Tobacco Use  . Smoking status: Never Smoker  . Smokeless tobacco: Never Used  Substance Use Topics  . Alcohol use: No  . Drug use: No     Allergies   Patient has no known allergies.   Review of Systems Review of Systems  Constitutional: Negative for chills and fever.  HENT: Negative for congestion, rhinorrhea, sinus pressure and sore throat.   Eyes: Negative for photophobia, pain, discharge, redness and visual disturbance.  Respiratory: Positive for shortness of breath. Negative for cough, chest tightness and wheezing.   Cardiovascular: Negative for chest pain, palpitations and leg swelling.  Gastrointestinal: Negative for abdominal pain, constipation, diarrhea, nausea and vomiting.  Endocrine: Negative for polyuria.  Genitourinary: Negative for difficulty urinating and dysuria.  Musculoskeletal: Negative for back pain and neck pain.  Skin: Negative for rash.  Neurological: Positive for weakness. Negative for dizziness, speech difficulty, light-headedness and headaches.  Hematological: Does not bruise/bleed easily.  Psychiatric/Behavioral:       + nervousness     Physical Exam Updated Vital Signs BP (!) 194/73 (BP Location: Right Arm)   Pulse 71   Temp 98.3 F (36.8 C) (Oral)   Resp (!) 23   Wt 117.9 kg   SpO2 96%   BMI 43.27 kg/m   Physical Exam Vitals signs and nursing note reviewed.  Constitutional:      General: She is not in acute distress.    Appearance: She is well-developed.  HENT:     Head: Normocephalic and atraumatic.     Mouth/Throat:     Pharynx: No oropharyngeal exudate.  Eyes:     General: No scleral icterus.       Right eye: No discharge.        Left eye: No discharge.     Conjunctiva/sclera: Conjunctivae normal.     Pupils: Pupils are equal, round, and reactive to light.  Neck:      Musculoskeletal: Normal range of motion and neck supple.     Thyroid: No thyromegaly.     Vascular: No JVD.  Cardiovascular:     Rate and Rhythm: Normal rate and regular rhythm.     Heart sounds: Normal heart sounds. No murmur. No friction rub. No gallop.   Pulmonary:     Effort: Pulmonary effort is normal. No respiratory distress.     Breath sounds: Normal breath sounds. No wheezing or rales.  Abdominal:     General: Bowel sounds are normal. There is no distension.  Palpations: Abdomen is soft. There is no mass.     Tenderness: There is no abdominal tenderness.  Musculoskeletal: Normal range of motion.        General: No tenderness.  Lymphadenopathy:     Cervical: No cervical adenopathy.  Skin:    General: Skin is warm and dry.     Findings: No erythema or rash.  Neurological:     Mental Status: She is alert.     Coordination: Coordination normal.     Comments: Neurologic exam:  Speech clear, pupils equal round reactive to light, extraocular movements intact  Normal peripheral visual fields Cranial nerves III through XII normal including no facial droop Follows commands, moves all extremities x4, normal strength to bilateral upper and lower extremities at all major muscle groups including grip Sensation normal to light touch and pinprick Coordination intact, no limb ataxia, finger-nose-finger normal, heel shin normal bilaterally Rapid alternating movements normal No pronator drift    Psychiatric:        Behavior: Behavior normal.      ED Treatments / Results  Labs (all labs ordered are listed, but only abnormal results are displayed) Labs Reviewed  SARS CORONAVIRUS 2 (HOSPITAL ORDER, PERFORMED IN [facility]) - Abnormal; Notable for the following components:      Result Value   SARS Coronavirus 2 POSITIVE (*)    All other components within normal limits  CBC WITH DIFFERENTIAL/PLATELET - Abnormal; Notable for the following components:   Abs Immature  Granulocytes 0.41 (*)    All other components within normal limits  COMPREHENSIVE METABOLIC PANEL - Abnormal; Notable for the following components:   Glucose, Bld 117 (*)    All other components within normal limits  URINALYSIS, ROUTINE W REFLEX MICROSCOPIC - Abnormal; Notable for the following components:   Color, Urine AMBER (*)    APPearance HAZY (*)    Bilirubin Urine SMALL (*)    Protein, ur 30 (*)    Bacteria, UA RARE (*)    All other components within normal limits  TROPONIN I  MAGNESIUM    EKG EKG Interpretation  Date/Time:  Monday April 30 2018 13:44:41 EDT Ventricular Rate:  79 PR Interval:    QRS Duration: 98 QT Interval:  384 QTC Calculation: 441 R Axis:   9 Text Interpretation:  Sinus rhythm Probable left atrial enlargement LVH with secondary repolarization abnormality Since last tracing ST abnormality presents inferior and lateral precordial leads though similar to 3/15 Confirmed by Eber HongMiller, Antoinette Borgwardt ([id number]) on 04/30/2018 2:06:09 PM   Radiology Dg Chest 2 View  Result Date: 04/30/2018 CLINICAL DATA:  Shortness of breath and weakness 2 days. EXAM: CHEST - 2 VIEW COMPARISON:  03/04/2013 FINDINGS: Lungs are adequately inflated without effusion. Possible mild left basilar opacification which may be due to atelectasis or early infection. Cardiomediastinal silhouette and remainder the exam is unchanged. IMPRESSION: Possible subtle left basilar opacification which may be due to atelectasis or early infection. Electronically Signed   By: Elberta Fortisaniel  Boyle M.D.   On: 04/30/2018 14:34   Ct Angio Chest Pe W And/or Wo Contrast  Result Date: 04/30/2018 CLINICAL DATA:  Two-day history of shortness of breath and weakness. EXAM: CT ANGIOGRAPHY CHEST WITH CONTRAST TECHNIQUE: Multidetector CT imaging of the chest was performed using the standard protocol during bolus administration of intravenous contrast. Multiplanar CT image reconstructions and MIPs were obtained to evaluate the vascular  anatomy. CONTRAST:  100mL OMNIPAQUE IOHEXOL 350 MG/ML SOLN COMPARISON:  None. FINDINGS: Cardiovascular: Heart size mildly  increased. No substantial pericardial effusion. Coronary artery calcification is evident. Atherosclerotic calcification is noted in the wall of the thoracic aorta. No filling defect within the opacified pulmonary arteries to suggest the presence of an acute pulmonary embolus. Mediastinum/Nodes: 10 mm left paratracheal lymph node is upper normal. 9 mm short axis right paratracheal lymph node is upper limits of normal for size. No mediastinal lymphadenopathy evident. There is no hilar lymphadenopathy. Tiny hiatal hernia. The esophagus has normal imaging features. There is no axillary lymphadenopathy. Lungs/Pleura: Peripheral patchy ground-glass opacities are identified in both lungs, involving the upper and lower lobes. No pleural effusion. Upper Abdomen: 3 cm ill-defined low-density subcapsular lesion identified anterior right liver (78/4). Musculoskeletal: No worrisome lytic or sclerotic osseous abnormality. Review of the MIP images confirms the above findings. IMPRESSION: 1. No CT evidence for acute pulmonary embolus. 2. Peripheral ground-glass opacities are identified in both lungs without pleural effusion. There are a spectrum of findings in the lungs which can be seen with acute atypical infection (as well as other non-infectious etiologies). In particular, viral pneumonia (including COVID-19) should be considered in the appropriate clinical setting. These findings were discussed with Angelica Chessman, RN, answering the phone for Dr. Hyacinth Meeker who was scrubbed in on a laceration repair. 3. 3 cm ill-defined low-density lesion in the anterior right liver. This is compatible with chronic subcapsular hematoma assessed by MRI on 11/22/2013. Electronically Signed   By: Kennith Center M.D.   On: 04/30/2018 18:32    Procedures Procedures (including critical care time)  Medications Ordered in ED Medications   amoxicillin-clavulanate (AUGMENTIN) 875-125 MG per tablet 1 tablet (1 tablet Oral Given 04/30/18 1708)  iohexol (OMNIPAQUE) 350 MG/ML injection 100 mL (100 mLs Intravenous Contrast Given 04/30/18 1721)     Initial Impression / Assessment and Plan / ED Course  I have reviewed the triage vital signs and the nursing notes.  Pertinent labs & imaging results that were available during my care of the patient were reviewed by me and considered in my medical decision making (see chart for details).  Clinical Course as of Apr 29 2020  Mon Apr 30, 2018  1458 I have personally seen and examined and interprette the xray - there is a subtle L lower lobe abnormalities - possible pna - she has no leukocytosis.     [BM]  1954 I have seen the CT scan there are findings of multiple small ground glass opacities - this is concerning for Covid pna - sat's still 97% at rest Covid pending   [BM]    Clinical Course User Index [BM] Eber Hong, MD       Exam is relatively normal - no focal neuro signs, VS without fever / tachycardia - she has some hypertension. Check labs, xray and UA.  Pt agreeable - nothing focal to suggest need for stroke w/u.  Has + covid - VS normal - BP 160/90, no hypoxia, or tachycadria - RR of 22 at this time of d/c Discussed all the risks with family and pt - appropriate quarantine understood as are the return precautions.  Whitney [name] was evaluated in Emergency Department on 04/30/2018 for the symptoms described in the history of present illness. She was evaluated in the context of the global COVID-19 pandemic, which necessitated consideration that the patient might be at risk for infection with the SARS-CoV-2 virus that causes COVID-19. Institutional protocols and algorithms that pertain to the evaluation of patients at risk for COVID-19 are in a state of rapid change  based on information released by regulatory bodies including the CDC and federal and state organizations.  These policies and algorithms were followed during the patient's care in the ED.   Final Clinical Impressions(s) / ED Diagnoses   Final diagnoses:  COVID-19 virus detected  Pneumonia due to COVID-19 virus      Eber Hong, MD 04/30/18 2022

## 2018-04-30 NOTE — ED Triage Notes (Addendum)
Pt reports SOB and weaknessx 2 days. Denies cough and pain

## 2018-04-30 NOTE — Discharge Instructions (Signed)
You have tested positive for Coronavirus This means that you will likely be having coughing, shortness of breath, weakness and fevers -  Today your vital signs look great - your oxygen in 97% and your heart rate is 80 If you develop worsening symptoms of difficulty breathing / chest pain or coughing or high fevers you MUST come back Tylenol only for fever - do not use ibuprofen      Person Under Monitoring Name: Whitney [name]  Location: [address] [place] [address]   Infection Prevention Recommendations for Individuals Confirmed to have, or Being Evaluated for, 2019 Novel Coronavirus (COVID-19) Infection Who Receive Care at Home  Individuals who are confirmed to have, or are being evaluated for, COVID-19 should follow the prevention steps below until a healthcare provider or local or state health department says they can return to normal activities.  Stay home except to get medical care You should restrict activities outside your home, except for getting medical care. Do not go to work, school, or public areas, and do not use public transportation or taxis.  Call ahead before visiting your doctor Before your medical appointment, call the healthcare provider and tell them that you have, or are being evaluated for, COVID-19 infection. This will help the healthcare providers office take steps to keep other people from getting infected. Ask your healthcare provider to call the local or state health department.  Monitor your symptoms Seek prompt medical attention if your illness is worsening (e.g., difficulty breathing). Before going to your medical appointment, call the healthcare provider and tell them that you have, or are being evaluated for, COVID-19 infection. Ask your healthcare provider to call the local or state health department.  Wear a facemask You should wear a facemask that covers your nose and mouth when you are in the same room with other people  and when you visit a healthcare provider. People who live with or visit you should also wear a facemask while they are in the same room with you.  Separate yourself from other people in your home As much as possible, you should stay in a different room from other people in your home. Also, you should use a separate bathroom, if available.  Avoid sharing household items You should not share dishes, drinking glasses, cups, eating utensils, towels, bedding, or other items with other people in your home. After using these items, you should wash them thoroughly with soap and water.  Cover your coughs and sneezes Cover your mouth and nose with a tissue when you cough or sneeze, or you can cough or sneeze into your sleeve. Throw used tissues in a lined trash can, and immediately wash your hands with soap and water for at least 20 seconds or use an alcohol-based hand rub.  Wash your [employer] your hands often and thoroughly with soap and water for at least 20 seconds. You can use an alcohol-based hand sanitizer if soap and water are not available and if your hands are not visibly dirty. Avoid touching your eyes, nose, and mouth with unwashed hands.   Prevention Steps for Caregivers and Household Members of Individuals Confirmed to have, or Being Evaluated for, COVID-19 Infection Being Cared for in the Home  If you live with, or provide care at home for, a person confirmed to have, or being evaluated for, COVID-19 infection please follow these guidelines to prevent infection:  Follow healthcare providers instructions Make sure that you understand and can help the patient follow any  healthcare provider instructions for all care.  Provide for the patients basic needs You should help the patient with basic needs in the home and provide support for getting groceries, prescriptions, and other personal needs.  Monitor the patients symptoms If they are getting sicker, call his or her medical  provider and tell them that the patient has, or is being evaluated for, COVID-19 infection. This will help the healthcare providers office take steps to keep other people from getting infected. Ask the healthcare provider to call the local or state health department.  Limit the number of people who have contact with the patient If possible, have only one caregiver for the patient. Other household members should stay in another home or place of residence. If this is not possible, they should stay in another room, or be separated from the patient as much as possible. Use a separate bathroom, if available. Restrict visitors who do not have an essential need to be in the home.  Keep older adults, very young children, and other sick people away from the patient Keep older adults, very young children, and those who have compromised immune systems or chronic health conditions away from the patient. This includes people with chronic heart, lung, or kidney conditions, diabetes, and cancer.  Ensure good ventilation Make sure that shared spaces in the home have good air flow, such as from an air conditioner or an opened window, weather permitting.  Wash your hands often Wash your hands often and thoroughly with soap and water for at least 20 seconds. You can use an alcohol based hand sanitizer if soap and water are not available and if your hands are not visibly dirty. Avoid touching your eyes, nose, and mouth with unwashed hands. Use disposable paper towels to dry your hands. If not available, use dedicated cloth towels and replace them when they become wet.  Wear a facemask and gloves Wear a disposable facemask at all times in the room and gloves when you touch or have contact with the patients blood, body fluids, and/or secretions or excretions, such as sweat, saliva, sputum, nasal mucus, vomit, urine, or feces.  Ensure the mask fits over your nose and mouth tightly, and do not touch it during  use. Throw out disposable facemasks and gloves after using them. Do not reuse. Wash your hands immediately after removing your facemask and gloves. If your personal clothing becomes contaminated, carefully remove clothing and launder. Wash your hands after handling contaminated clothing. Place all used disposable facemasks, gloves, and other waste in a lined container before disposing them with other household waste. Remove gloves and wash your hands immediately after handling these items.  Do not share dishes, glasses, or other household items with the patient Avoid sharing household items. You should not share dishes, drinking glasses, cups, eating utensils, towels, bedding, or other items with a patient who is confirmed to have, or being evaluated for, COVID-19 infection. After the person uses these items, you should wash them thoroughly with soap and water.  Wash laundry thoroughly Immediately remove and wash clothes or bedding that have blood, body fluids, and/or secretions or excretions, such as sweat, saliva, sputum, nasal mucus, vomit, urine, or feces, on them. Wear gloves when handling laundry from the patient. Read and follow directions on labels of laundry or clothing items and detergent. In general, wash and dry with the warmest temperatures recommended on the label.  Clean all areas the individual has used often Clean all touchable surfaces, such as counters, tabletops,  doorknobs, bathroom fixtures, toilets, phones, keyboards, tablets, and bedside tables, every day. Also, clean any surfaces that may have blood, body fluids, and/or secretions or excretions on them. Wear gloves when cleaning surfaces the patient has come in contact with. Use a diluted bleach solution (e.g., dilute bleach with 1 part bleach and 10 parts water) or a household disinfectant with a label that says EPA-registered for coronaviruses. To make a bleach solution at home, add 1 tablespoon of bleach to 1 quart (4  cups) of water. For a larger supply, add  cup of bleach to 1 gallon (16 cups) of water. Read labels of cleaning products and follow recommendations provided on product labels. Labels contain instructions for safe and effective use of the cleaning product including precautions you should take when applying the product, such as wearing gloves or eye protection and making sure you have good ventilation during use of the product. Remove gloves and wash hands immediately after cleaning.  Monitor yourself for signs and symptoms of illness Caregivers and household members are considered close contacts, should monitor their health, and will be asked to limit movement outside of the home to the extent possible. Follow the monitoring steps for close contacts listed on the symptom monitoring form.   ? If you have additional questions, contact your local health department or call the epidemiologist on call at [phone] (available 24/7). ? This guidance is subject to change. For the most up-to-date guidance from [facility], please refer to their website: [email or web address]

## 2018-04-30 NOTE — ED Notes (Signed)
Pt ambulated O2 stayed between 95-100

## 2018-05-01 NOTE — ED Provider Notes (Signed)
I called the patient this morning and she is doing very well - sititng up and walking and eating and drinking - not feeling SOB.  Drinda Butts, the daughter answered and is also doing well - I reviewed the indications for return and she is in total understanding.   Eber Hong, MD 05/01/18 [phone]

## 2018-06-01 ENCOUNTER — Telehealth: Payer: Self-pay | Admitting: *Deleted

## 2018-06-01 NOTE — Telephone Encounter (Signed)
I attempted twice to call pt however both times the line would disconnect when someone picked up the phone.  Unable to contact this pt.

## 2019-02-04 ENCOUNTER — Encounter: Payer: Self-pay | Admitting: Internal Medicine

## 2020-11-09 DIAGNOSIS — I739 Peripheral vascular disease, unspecified: Secondary | ICD-10-CM | POA: Diagnosis not present

## 2020-11-09 DIAGNOSIS — B351 Tinea unguium: Secondary | ICD-10-CM | POA: Diagnosis not present

## 2020-11-09 DIAGNOSIS — M79675 Pain in left toe(s): Secondary | ICD-10-CM | POA: Diagnosis not present

## 2020-11-09 DIAGNOSIS — L600 Ingrowing nail: Secondary | ICD-10-CM | POA: Diagnosis not present

## 2020-11-30 DIAGNOSIS — I10 Essential (primary) hypertension: Secondary | ICD-10-CM | POA: Diagnosis not present

## 2020-11-30 DIAGNOSIS — E119 Type 2 diabetes mellitus without complications: Secondary | ICD-10-CM | POA: Diagnosis not present

## 2020-11-30 DIAGNOSIS — R600 Localized edema: Secondary | ICD-10-CM | POA: Diagnosis not present

## 2020-11-30 DIAGNOSIS — E7800 Pure hypercholesterolemia, unspecified: Secondary | ICD-10-CM | POA: Diagnosis not present

## 2020-12-01 DIAGNOSIS — R600 Localized edema: Secondary | ICD-10-CM | POA: Diagnosis not present

## 2020-12-01 DIAGNOSIS — I10 Essential (primary) hypertension: Secondary | ICD-10-CM | POA: Diagnosis not present

## 2020-12-01 DIAGNOSIS — E7800 Pure hypercholesterolemia, unspecified: Secondary | ICD-10-CM | POA: Diagnosis not present

## 2020-12-01 DIAGNOSIS — E119 Type 2 diabetes mellitus without complications: Secondary | ICD-10-CM | POA: Diagnosis not present

## 2021-02-01 DIAGNOSIS — I739 Peripheral vascular disease, unspecified: Secondary | ICD-10-CM | POA: Diagnosis not present

## 2021-02-01 DIAGNOSIS — L600 Ingrowing nail: Secondary | ICD-10-CM | POA: Diagnosis not present

## 2021-02-01 DIAGNOSIS — B351 Tinea unguium: Secondary | ICD-10-CM | POA: Diagnosis not present

## 2021-02-01 DIAGNOSIS — M79675 Pain in left toe(s): Secondary | ICD-10-CM | POA: Diagnosis not present

## 2021-03-29 DIAGNOSIS — E119 Type 2 diabetes mellitus without complications: Secondary | ICD-10-CM | POA: Diagnosis not present

## 2021-03-29 DIAGNOSIS — R600 Localized edema: Secondary | ICD-10-CM | POA: Diagnosis not present

## 2021-03-29 DIAGNOSIS — I10 Essential (primary) hypertension: Secondary | ICD-10-CM | POA: Diagnosis not present

## 2021-03-29 DIAGNOSIS — E7800 Pure hypercholesterolemia, unspecified: Secondary | ICD-10-CM | POA: Diagnosis not present

## 2021-04-19 DIAGNOSIS — M79675 Pain in left toe(s): Secondary | ICD-10-CM | POA: Diagnosis not present

## 2021-04-19 DIAGNOSIS — L600 Ingrowing nail: Secondary | ICD-10-CM | POA: Diagnosis not present

## 2021-04-19 DIAGNOSIS — B351 Tinea unguium: Secondary | ICD-10-CM | POA: Diagnosis not present

## 2021-04-19 DIAGNOSIS — I739 Peripheral vascular disease, unspecified: Secondary | ICD-10-CM | POA: Diagnosis not present

## 2021-06-23 DIAGNOSIS — H5213 Myopia, bilateral: Secondary | ICD-10-CM | POA: Diagnosis not present

## 2021-06-28 DIAGNOSIS — B351 Tinea unguium: Secondary | ICD-10-CM | POA: Diagnosis not present

## 2021-06-28 DIAGNOSIS — M79675 Pain in left toe(s): Secondary | ICD-10-CM | POA: Diagnosis not present

## 2021-06-28 DIAGNOSIS — I739 Peripheral vascular disease, unspecified: Secondary | ICD-10-CM | POA: Diagnosis not present

## 2021-06-28 DIAGNOSIS — L600 Ingrowing nail: Secondary | ICD-10-CM | POA: Diagnosis not present

## 2021-07-26 DIAGNOSIS — E7800 Pure hypercholesterolemia, unspecified: Secondary | ICD-10-CM | POA: Diagnosis not present

## 2021-07-26 DIAGNOSIS — I10 Essential (primary) hypertension: Secondary | ICD-10-CM | POA: Diagnosis not present

## 2021-07-26 DIAGNOSIS — E119 Type 2 diabetes mellitus without complications: Secondary | ICD-10-CM | POA: Diagnosis not present

## 2021-07-26 DIAGNOSIS — R600 Localized edema: Secondary | ICD-10-CM | POA: Diagnosis not present

## 2021-09-13 DIAGNOSIS — M79675 Pain in left toe(s): Secondary | ICD-10-CM | POA: Diagnosis not present

## 2021-09-13 DIAGNOSIS — B351 Tinea unguium: Secondary | ICD-10-CM | POA: Diagnosis not present

## 2021-09-13 DIAGNOSIS — I739 Peripheral vascular disease, unspecified: Secondary | ICD-10-CM | POA: Diagnosis not present

## 2021-09-13 DIAGNOSIS — L600 Ingrowing nail: Secondary | ICD-10-CM | POA: Diagnosis not present

## 2021-11-22 DIAGNOSIS — I739 Peripheral vascular disease, unspecified: Secondary | ICD-10-CM | POA: Diagnosis not present

## 2021-11-22 DIAGNOSIS — M79675 Pain in left toe(s): Secondary | ICD-10-CM | POA: Diagnosis not present

## 2021-11-22 DIAGNOSIS — B351 Tinea unguium: Secondary | ICD-10-CM | POA: Diagnosis not present

## 2021-11-22 DIAGNOSIS — M79674 Pain in right toe(s): Secondary | ICD-10-CM | POA: Diagnosis not present

## 2021-11-29 DIAGNOSIS — M1710 Unilateral primary osteoarthritis, unspecified knee: Secondary | ICD-10-CM | POA: Diagnosis not present

## 2021-11-29 DIAGNOSIS — I10 Essential (primary) hypertension: Secondary | ICD-10-CM | POA: Diagnosis not present

## 2021-11-29 DIAGNOSIS — R600 Localized edema: Secondary | ICD-10-CM | POA: Diagnosis not present

## 2021-11-29 DIAGNOSIS — E7800 Pure hypercholesterolemia, unspecified: Secondary | ICD-10-CM | POA: Diagnosis not present

## 2022-01-31 DIAGNOSIS — M79675 Pain in left toe(s): Secondary | ICD-10-CM | POA: Diagnosis not present

## 2022-01-31 DIAGNOSIS — B351 Tinea unguium: Secondary | ICD-10-CM | POA: Diagnosis not present

## 2022-01-31 DIAGNOSIS — I739 Peripheral vascular disease, unspecified: Secondary | ICD-10-CM | POA: Diagnosis not present

## 2022-01-31 DIAGNOSIS — M79674 Pain in right toe(s): Secondary | ICD-10-CM | POA: Diagnosis not present

## 2022-03-21 DIAGNOSIS — E7800 Pure hypercholesterolemia, unspecified: Secondary | ICD-10-CM | POA: Diagnosis not present

## 2022-03-21 DIAGNOSIS — R600 Localized edema: Secondary | ICD-10-CM | POA: Diagnosis not present

## 2022-03-21 DIAGNOSIS — E119 Type 2 diabetes mellitus without complications: Secondary | ICD-10-CM | POA: Diagnosis not present

## 2022-03-21 DIAGNOSIS — I10 Essential (primary) hypertension: Secondary | ICD-10-CM | POA: Diagnosis not present

## 2022-03-22 DIAGNOSIS — E559 Vitamin D deficiency, unspecified: Secondary | ICD-10-CM | POA: Diagnosis not present

## 2022-03-22 DIAGNOSIS — E119 Type 2 diabetes mellitus without complications: Secondary | ICD-10-CM | POA: Diagnosis not present

## 2022-03-22 DIAGNOSIS — R600 Localized edema: Secondary | ICD-10-CM | POA: Diagnosis not present

## 2022-04-18 DIAGNOSIS — M79674 Pain in right toe(s): Secondary | ICD-10-CM | POA: Diagnosis not present

## 2022-04-18 DIAGNOSIS — M79675 Pain in left toe(s): Secondary | ICD-10-CM | POA: Diagnosis not present

## 2022-04-18 DIAGNOSIS — B351 Tinea unguium: Secondary | ICD-10-CM | POA: Diagnosis not present

## 2022-04-18 DIAGNOSIS — I739 Peripheral vascular disease, unspecified: Secondary | ICD-10-CM | POA: Diagnosis not present

## 2022-04-20 DIAGNOSIS — I10 Essential (primary) hypertension: Secondary | ICD-10-CM | POA: Diagnosis not present

## 2022-06-14 DIAGNOSIS — I10 Essential (primary) hypertension: Secondary | ICD-10-CM | POA: Diagnosis not present

## 2022-06-14 DIAGNOSIS — K219 Gastro-esophageal reflux disease without esophagitis: Secondary | ICD-10-CM | POA: Diagnosis not present

## 2022-06-14 DIAGNOSIS — E782 Mixed hyperlipidemia: Secondary | ICD-10-CM | POA: Diagnosis not present

## 2022-06-14 DIAGNOSIS — R600 Localized edema: Secondary | ICD-10-CM | POA: Diagnosis not present

## 2022-07-04 DIAGNOSIS — B351 Tinea unguium: Secondary | ICD-10-CM | POA: Diagnosis not present

## 2022-07-04 DIAGNOSIS — I739 Peripheral vascular disease, unspecified: Secondary | ICD-10-CM | POA: Diagnosis not present

## 2022-07-04 DIAGNOSIS — M79675 Pain in left toe(s): Secondary | ICD-10-CM | POA: Diagnosis not present

## 2022-07-04 DIAGNOSIS — M79674 Pain in right toe(s): Secondary | ICD-10-CM | POA: Diagnosis not present

## 2022-07-18 DIAGNOSIS — E782 Mixed hyperlipidemia: Secondary | ICD-10-CM | POA: Diagnosis not present

## 2022-07-18 DIAGNOSIS — I10 Essential (primary) hypertension: Secondary | ICD-10-CM | POA: Diagnosis not present

## 2022-07-20 DIAGNOSIS — I10 Essential (primary) hypertension: Secondary | ICD-10-CM | POA: Diagnosis not present

## 2022-07-20 DIAGNOSIS — E6609 Other obesity due to excess calories: Secondary | ICD-10-CM | POA: Diagnosis not present

## 2022-07-20 DIAGNOSIS — E7800 Pure hypercholesterolemia, unspecified: Secondary | ICD-10-CM | POA: Diagnosis not present

## 2022-07-20 DIAGNOSIS — K2100 Gastro-esophageal reflux disease with esophagitis, without bleeding: Secondary | ICD-10-CM | POA: Diagnosis not present

## 2022-07-22 DIAGNOSIS — I10 Essential (primary) hypertension: Secondary | ICD-10-CM | POA: Diagnosis not present

## 2022-07-22 DIAGNOSIS — E870 Hyperosmolality and hypernatremia: Secondary | ICD-10-CM | POA: Diagnosis not present

## 2022-07-22 DIAGNOSIS — R7301 Impaired fasting glucose: Secondary | ICD-10-CM | POA: Diagnosis not present

## 2022-07-22 DIAGNOSIS — Z0000 Encounter for general adult medical examination without abnormal findings: Secondary | ICD-10-CM | POA: Diagnosis not present

## 2022-07-22 DIAGNOSIS — K219 Gastro-esophageal reflux disease without esophagitis: Secondary | ICD-10-CM | POA: Diagnosis not present

## 2022-07-22 DIAGNOSIS — Z0001 Encounter for general adult medical examination with abnormal findings: Secondary | ICD-10-CM | POA: Diagnosis not present

## 2022-07-22 DIAGNOSIS — E782 Mixed hyperlipidemia: Secondary | ICD-10-CM | POA: Diagnosis not present

## 2022-07-22 DIAGNOSIS — R944 Abnormal results of kidney function studies: Secondary | ICD-10-CM | POA: Diagnosis not present

## 2022-09-19 DIAGNOSIS — M79674 Pain in right toe(s): Secondary | ICD-10-CM | POA: Diagnosis not present

## 2022-09-19 DIAGNOSIS — M79675 Pain in left toe(s): Secondary | ICD-10-CM | POA: Diagnosis not present

## 2022-09-19 DIAGNOSIS — B351 Tinea unguium: Secondary | ICD-10-CM | POA: Diagnosis not present

## 2022-11-28 DIAGNOSIS — M79675 Pain in left toe(s): Secondary | ICD-10-CM | POA: Diagnosis not present

## 2022-11-28 DIAGNOSIS — B351 Tinea unguium: Secondary | ICD-10-CM | POA: Diagnosis not present

## 2022-11-28 DIAGNOSIS — M79674 Pain in right toe(s): Secondary | ICD-10-CM | POA: Diagnosis not present

## 2023-01-18 DIAGNOSIS — R7301 Impaired fasting glucose: Secondary | ICD-10-CM | POA: Diagnosis not present

## 2023-01-18 DIAGNOSIS — I10 Essential (primary) hypertension: Secondary | ICD-10-CM | POA: Diagnosis not present

## 2023-01-24 DIAGNOSIS — R7303 Prediabetes: Secondary | ICD-10-CM | POA: Diagnosis not present

## 2023-01-24 DIAGNOSIS — E782 Mixed hyperlipidemia: Secondary | ICD-10-CM | POA: Diagnosis not present

## 2023-01-24 DIAGNOSIS — I10 Essential (primary) hypertension: Secondary | ICD-10-CM | POA: Diagnosis not present

## 2023-01-24 DIAGNOSIS — R634 Abnormal weight loss: Secondary | ICD-10-CM | POA: Diagnosis not present

## 2023-02-06 DIAGNOSIS — B351 Tinea unguium: Secondary | ICD-10-CM | POA: Diagnosis not present

## 2023-02-06 DIAGNOSIS — M79675 Pain in left toe(s): Secondary | ICD-10-CM | POA: Diagnosis not present

## 2023-02-06 DIAGNOSIS — M79674 Pain in right toe(s): Secondary | ICD-10-CM | POA: Diagnosis not present

## 2023-04-17 DIAGNOSIS — I739 Peripheral vascular disease, unspecified: Secondary | ICD-10-CM | POA: Diagnosis not present

## 2023-04-17 DIAGNOSIS — M79675 Pain in left toe(s): Secondary | ICD-10-CM | POA: Diagnosis not present

## 2023-04-17 DIAGNOSIS — B351 Tinea unguium: Secondary | ICD-10-CM | POA: Diagnosis not present

## 2023-04-17 DIAGNOSIS — L851 Acquired keratosis [keratoderma] palmaris et plantaris: Secondary | ICD-10-CM | POA: Diagnosis not present

## 2023-04-17 DIAGNOSIS — M79674 Pain in right toe(s): Secondary | ICD-10-CM | POA: Diagnosis not present

## 2023-04-24 DIAGNOSIS — I10 Essential (primary) hypertension: Secondary | ICD-10-CM | POA: Diagnosis not present

## 2023-04-24 DIAGNOSIS — R7303 Prediabetes: Secondary | ICD-10-CM | POA: Diagnosis not present

## 2023-04-28 DIAGNOSIS — R634 Abnormal weight loss: Secondary | ICD-10-CM | POA: Diagnosis not present

## 2023-04-28 DIAGNOSIS — Z23 Encounter for immunization: Secondary | ICD-10-CM | POA: Diagnosis not present

## 2023-04-28 DIAGNOSIS — I10 Essential (primary) hypertension: Secondary | ICD-10-CM | POA: Diagnosis not present

## 2023-04-28 DIAGNOSIS — E782 Mixed hyperlipidemia: Secondary | ICD-10-CM | POA: Diagnosis not present

## 2023-04-28 DIAGNOSIS — R7303 Prediabetes: Secondary | ICD-10-CM | POA: Diagnosis not present

## 2023-08-02 DIAGNOSIS — R7303 Prediabetes: Secondary | ICD-10-CM | POA: Diagnosis not present

## 2023-08-08 DIAGNOSIS — E782 Mixed hyperlipidemia: Secondary | ICD-10-CM | POA: Diagnosis not present

## 2023-08-08 DIAGNOSIS — R7303 Prediabetes: Secondary | ICD-10-CM | POA: Diagnosis not present

## 2023-08-08 DIAGNOSIS — Z0000 Encounter for general adult medical examination without abnormal findings: Secondary | ICD-10-CM | POA: Diagnosis not present

## 2023-08-08 DIAGNOSIS — I10 Essential (primary) hypertension: Secondary | ICD-10-CM | POA: Diagnosis not present

## 2023-08-08 DIAGNOSIS — Z23 Encounter for immunization: Secondary | ICD-10-CM | POA: Diagnosis not present

## 2023-09-26 DIAGNOSIS — I10 Essential (primary) hypertension: Secondary | ICD-10-CM | POA: Diagnosis not present

## 2023-11-13 DIAGNOSIS — R7303 Prediabetes: Secondary | ICD-10-CM | POA: Diagnosis not present
